# Patient Record
Sex: Female | Born: 1970
Health system: Southern US, Community
[De-identification: ages and names within clinical notes are randomized; demographics above are authoritative.]

## PROBLEM LIST (undated history)

## (undated) DIAGNOSIS — F419 Anxiety disorder, unspecified: Secondary | ICD-10-CM

## (undated) DIAGNOSIS — K2960 Other gastritis without bleeding: Secondary | ICD-10-CM

## (undated) DIAGNOSIS — I10 Essential (primary) hypertension: Secondary | ICD-10-CM

## (undated) DIAGNOSIS — E785 Hyperlipidemia, unspecified: Secondary | ICD-10-CM

## (undated) DIAGNOSIS — K449 Diaphragmatic hernia without obstruction or gangrene: Secondary | ICD-10-CM

## (undated) DIAGNOSIS — Z9889 Other specified postprocedural states: Secondary | ICD-10-CM

## (undated) DIAGNOSIS — J302 Other seasonal allergic rhinitis: Secondary | ICD-10-CM

## (undated) DIAGNOSIS — N809 Endometriosis, unspecified: Secondary | ICD-10-CM

## (undated) DIAGNOSIS — J45909 Unspecified asthma, uncomplicated: Secondary | ICD-10-CM

## (undated) DIAGNOSIS — T7840XA Allergy, unspecified, initial encounter: Secondary | ICD-10-CM

## (undated) DIAGNOSIS — R112 Nausea with vomiting, unspecified: Secondary | ICD-10-CM

## (undated) DIAGNOSIS — R519 Headache, unspecified: Secondary | ICD-10-CM

## (undated) DIAGNOSIS — L309 Dermatitis, unspecified: Secondary | ICD-10-CM

## (undated) DIAGNOSIS — R51 Headache: Secondary | ICD-10-CM

## (undated) DIAGNOSIS — K2210 Ulcer of esophagus without bleeding: Secondary | ICD-10-CM

## (undated) HISTORY — PX: ABDOMINAL HYSTERECTOMY: SHX81

## (undated) HISTORY — PX: BLADDER SURGERY: SHX569

## (undated) HISTORY — PX: CYST EXCISION: SHX5701

---

## 2003-10-10 ENCOUNTER — Other Ambulatory Visit: Admission: RE | Admit: 2003-10-10 | Discharge: 2003-10-10 | Payer: Self-pay | Admitting: Obstetrics and Gynecology

## 2004-02-06 ENCOUNTER — Ambulatory Visit (HOSPITAL_COMMUNITY): Admission: RE | Admit: 2004-02-06 | Discharge: 2004-02-06 | Payer: Self-pay | Admitting: Obstetrics and Gynecology

## 2004-07-09 ENCOUNTER — Inpatient Hospital Stay (HOSPITAL_COMMUNITY): Admission: RE | Admit: 2004-07-09 | Discharge: 2004-07-11 | Payer: Self-pay | Admitting: Obstetrics and Gynecology

## 2007-10-22 ENCOUNTER — Ambulatory Visit: Payer: Self-pay | Admitting: Orthopaedic Surgery

## 2007-10-22 ENCOUNTER — Other Ambulatory Visit: Payer: Self-pay

## 2007-10-29 ENCOUNTER — Ambulatory Visit: Payer: Self-pay | Admitting: Orthopaedic Surgery

## 2008-11-09 ENCOUNTER — Ambulatory Visit: Payer: Self-pay | Admitting: Family Medicine

## 2009-01-31 ENCOUNTER — Ambulatory Visit: Payer: Self-pay | Admitting: Family Medicine

## 2009-02-16 ENCOUNTER — Ambulatory Visit: Payer: Self-pay | Admitting: Internal Medicine

## 2009-12-26 ENCOUNTER — Encounter: Payer: Self-pay | Admitting: Orthopedic Surgery

## 2010-09-18 ENCOUNTER — Ambulatory Visit: Payer: Self-pay | Admitting: Family Medicine

## 2011-04-09 ENCOUNTER — Other Ambulatory Visit: Payer: Self-pay | Admitting: Neurology

## 2011-04-09 DIAGNOSIS — R0989 Other specified symptoms and signs involving the circulatory and respiratory systems: Secondary | ICD-10-CM

## 2011-04-15 ENCOUNTER — Ambulatory Visit
Admission: RE | Admit: 2011-04-15 | Discharge: 2011-04-15 | Disposition: A | Discharge status: Home / Self Care | Payer: 59 | Source: Ambulatory Visit | Attending: Neurology | Admitting: Neurology

## 2011-04-15 DIAGNOSIS — R0989 Other specified symptoms and signs involving the circulatory and respiratory systems: Secondary | ICD-10-CM

## 2011-09-19 ENCOUNTER — Ambulatory Visit: Payer: Self-pay | Admitting: Family Medicine

## 2012-06-12 ENCOUNTER — Emergency Department: Payer: Self-pay | Admitting: Emergency Medicine

## 2012-09-30 ENCOUNTER — Ambulatory Visit: Payer: Self-pay | Admitting: Family Medicine

## 2013-01-14 ENCOUNTER — Emergency Department: Payer: Self-pay | Admitting: Emergency Medicine

## 2013-11-04 ENCOUNTER — Ambulatory Visit: Payer: Self-pay | Admitting: Family Medicine

## 2014-11-08 ENCOUNTER — Other Ambulatory Visit: Payer: Self-pay | Admitting: Family Medicine

## 2014-11-08 ENCOUNTER — Ambulatory Visit
Admission: RE | Admit: 2014-11-08 | Discharge: 2014-11-08 | Disposition: A | Discharge status: Home / Self Care | Payer: 59 | Source: Ambulatory Visit | Attending: Family Medicine | Admitting: Family Medicine

## 2014-11-08 DIAGNOSIS — Z1239 Encounter for other screening for malignant neoplasm of breast: Secondary | ICD-10-CM

## 2014-11-08 DIAGNOSIS — Z1231 Encounter for screening mammogram for malignant neoplasm of breast: Secondary | ICD-10-CM | POA: Diagnosis not present

## 2015-02-10 ENCOUNTER — Encounter: Payer: Self-pay | Admitting: *Deleted

## 2015-02-13 ENCOUNTER — Encounter
Admission: RE | Disposition: A | Discharge status: Home Health | Payer: Self-pay | Source: Ambulatory Visit | Attending: Gastroenterology

## 2015-02-13 ENCOUNTER — Ambulatory Visit
Admission: RE | Admit: 2015-02-13 | Discharge: 2015-02-13 | Disposition: A | Discharge status: Home Health | Payer: 59 | Source: Ambulatory Visit | Attending: Gastroenterology | Admitting: Gastroenterology

## 2015-02-13 ENCOUNTER — Ambulatory Visit: Payer: 59

## 2015-02-13 ENCOUNTER — Encounter: Payer: Self-pay | Admitting: Anesthesiology

## 2015-02-13 ENCOUNTER — Ambulatory Visit: Payer: 59 | Admitting: Certified Registered Nurse Anesthetist

## 2015-02-13 DIAGNOSIS — K2960 Other gastritis without bleeding: Secondary | ICD-10-CM | POA: Diagnosis not present

## 2015-02-13 DIAGNOSIS — E785 Hyperlipidemia, unspecified: Secondary | ICD-10-CM | POA: Diagnosis not present

## 2015-02-13 DIAGNOSIS — Z79899 Other long term (current) drug therapy: Secondary | ICD-10-CM | POA: Insufficient documentation

## 2015-02-13 DIAGNOSIS — K2210 Ulcer of esophagus without bleeding: Secondary | ICD-10-CM | POA: Diagnosis not present

## 2015-02-13 DIAGNOSIS — I10 Essential (primary) hypertension: Secondary | ICD-10-CM | POA: Insufficient documentation

## 2015-02-13 DIAGNOSIS — R252 Cramp and spasm: Secondary | ICD-10-CM | POA: Insufficient documentation

## 2015-02-13 DIAGNOSIS — J45909 Unspecified asthma, uncomplicated: Secondary | ICD-10-CM | POA: Diagnosis not present

## 2015-02-13 DIAGNOSIS — K449 Diaphragmatic hernia without obstruction or gangrene: Secondary | ICD-10-CM | POA: Insufficient documentation

## 2015-02-13 DIAGNOSIS — R1013 Epigastric pain: Secondary | ICD-10-CM | POA: Diagnosis present

## 2015-02-13 DIAGNOSIS — K219 Gastro-esophageal reflux disease without esophagitis: Secondary | ICD-10-CM | POA: Insufficient documentation

## 2015-02-13 DIAGNOSIS — K319 Disease of stomach and duodenum, unspecified: Secondary | ICD-10-CM | POA: Insufficient documentation

## 2015-02-13 DIAGNOSIS — Z7951 Long term (current) use of inhaled steroids: Secondary | ICD-10-CM | POA: Insufficient documentation

## 2015-02-13 DIAGNOSIS — F419 Anxiety disorder, unspecified: Secondary | ICD-10-CM | POA: Insufficient documentation

## 2015-02-13 DIAGNOSIS — T819XXA Unspecified complication of procedure, initial encounter: Secondary | ICD-10-CM

## 2015-02-13 DIAGNOSIS — G43909 Migraine, unspecified, not intractable, without status migrainosus: Secondary | ICD-10-CM | POA: Insufficient documentation

## 2015-02-13 HISTORY — PX: ESOPHAGOGASTRODUODENOSCOPY (EGD) WITH PROPOFOL: SHX5813

## 2015-02-13 HISTORY — DX: Headache, unspecified: R51.9

## 2015-02-13 HISTORY — DX: Hyperlipidemia, unspecified: E78.5

## 2015-02-13 HISTORY — DX: Unspecified asthma, uncomplicated: J45.909

## 2015-02-13 HISTORY — DX: Essential (primary) hypertension: I10

## 2015-02-13 HISTORY — DX: Headache: R51

## 2015-02-13 HISTORY — DX: Anxiety disorder, unspecified: F41.9

## 2015-02-13 SURGERY — ESOPHAGOGASTRODUODENOSCOPY (EGD) WITH PROPOFOL
Anesthesia: General

## 2015-02-13 MED ORDER — ALBUTEROL SULFATE HFA 108 (90 BASE) MCG/ACT IN AERS
INHALATION_SPRAY | RESPIRATORY_TRACT | Status: DC | PRN
  Administered 2015-02-13: 4 via RESPIRATORY_TRACT

## 2015-02-13 MED ORDER — LIDOCAINE HCL (CARDIAC) 20 MG/ML IV SOLN
INTRAVENOUS | Status: DC | PRN
  Administered 2015-02-13: 100 mg via INTRAVENOUS

## 2015-02-13 MED ORDER — IPRATROPIUM-ALBUTEROL 0.5-2.5 (3) MG/3ML IN SOLN
RESPIRATORY_TRACT | Status: AC
  Administered 2015-02-13: 3 mL via RESPIRATORY_TRACT
  Filled 2015-02-13: qty 3

## 2015-02-13 MED ORDER — SODIUM CHLORIDE 0.9 % IV SOLN
INTRAVENOUS | Status: DC
  Administered 2015-02-13: 1000 mL via INTRAVENOUS

## 2015-02-13 MED ORDER — SODIUM CHLORIDE 0.9 % IV SOLN: INTRAVENOUS | Status: DC

## 2015-02-13 MED ORDER — IPRATROPIUM-ALBUTEROL 0.5-2.5 (3) MG/3ML IN SOLN
3.0000 mL | Freq: Once | RESPIRATORY_TRACT | Status: AC
  Administered 2015-02-13: 3 mL via RESPIRATORY_TRACT

## 2015-02-13 MED ORDER — PROPOFOL INFUSION 10 MG/ML OPTIME
INTRAVENOUS | Status: DC | PRN
  Administered 2015-02-13: 200 ug/kg/min via INTRAVENOUS

## 2015-02-13 MED ORDER — GLYCOPYRROLATE 0.2 MG/ML IJ SOLN
INTRAMUSCULAR | Status: DC | PRN
  Administered 2015-02-13: 0.2 mg via INTRAVENOUS

## 2015-02-13 MED ORDER — PROPOFOL 10 MG/ML IV BOLUS
INTRAVENOUS | Status: DC | PRN
  Administered 2015-02-13 (×2): 30 mg via INTRAVENOUS
  Administered 2015-02-13: 80 mg via INTRAVENOUS

## 2015-02-13 NOTE — Op Note (Signed)
[facility] Patient Name: Linda Lyons Procedure Date: 02/13/2015 3:43 PM MRN: [id number] Account #: [id number] Date of Birth: 11/14/1970 Admit Type: Outpatient Age: 44 Room: [facility] ENDO ROOM 4 Gender: Female Note Status: Finalized Procedure:         Upper GI endoscopy Indications:       Dyspepsia, Gastro-esophageal reflux disease Providers:         Christena Deem, MD Referring MD:      Marina Goodell (Referring MD) Medicines:         Monitored Anesthesia Care Complications:     No immediate complications. Procedure:         Pre-Anesthesia Assessment:                    - ASA Grade Assessment: III - A patient with severe                     systemic disease.                    After obtaining informed consent, the endoscope was passed                     under direct vision. Throughout the procedure, the                     patient's blood pressure, pulse, and oxygen saturations                     were monitored continuously. The Endoscope was introduced                     through the mouth, and advanced to the third part of                     duodenum. The upper GI endoscopy was accomplished without                     difficulty. The patient tolerated the procedure. Findings:      LA Grade D (one or more mucosal breaks involving at least 75% of       esophageal circumference) esophagitis with no bleeding was found.      A medium-sized hiatus hernia was found. The Z-line was a variable       distance from incisors; the hiatal hernia was sliding.      Patchy moderate inflammation characterized by congestion (edema),       erosions, erythema and linear erosions was found in the gastric antrum.       Biopsies were taken with a cold forceps for histology. Biopsies were       taken with a cold forceps for Helicobacter pylori testing.      The cardia and gastric fundus were normal on retroflexion otherwise.      Diffuse mucosal  flattening was found in the entire examined duodenum.       Biopsies were taken with a cold forceps for histology. Impression:        - LA Grade D erosive esophagitis.                    - Medium-sized hiatus hernia.                    - Erosive gastritis. Biopsied.                    -  Flattened mucosa was found in the duodenum, not                     consistent with celiac disease. Biopsied. Recommendation:    - Await pathology results.                    - Use Protonix (pantoprazole) 40 mg PO BID for 6 weeks.                    - Use Protonix (pantoprazole) 40 mg PO daily daily.                    - Repeat the upper endoscopy in 6 weeks to check healing. Procedure Code(s): --- Professional ---                    [phone], Esophagogastroduodenoscopy, flexible, transoral;                     with biopsy, single or multiple Diagnosis Code(s): --- Professional ---                    530.19, Other esophagitis                    535.40, Other specified gastritis, without mention of                     hemorrhage                    537.9, Unspecified disorder of stomach and duodenum                    536.8, Dyspepsia and other specified disorders of function                     of stomach                    530.81, Esophageal reflux                    553.3, Diaphragmatic hernia without mention of obstruction                     or gangrene CPT copyright 2014 [employer]. All rights reserved. The codes documented in this report are preliminary and upon coder review may  be revised to meet current compliance requirements. Christena Deem, MD 02/13/2015 4:01:42 PM This report has been signed electronically. Number of Addenda: 0 Note Initiated On: 02/13/2015 3:43 PM      [facility]

## 2015-02-13 NOTE — Anesthesia Preprocedure Evaluation (Signed)
Anesthesia Evaluation  Patient identified by MRN, date of birth, ID band Patient awake    Reviewed: Allergy & Precautions, H&P , NPO status , Patient's Chart, lab work & pertinent test results  History of Anesthesia Complications (+) PONV and history of anesthetic complications  Airway Mallampati: III  TM Distance: >3 FB Neck ROM: full    Dental no notable dental hx. (+) Teeth Intact   Pulmonary neg shortness of breath, asthma ,  breath sounds clear to auscultation  Pulmonary exam normal       Cardiovascular Exercise Tolerance: Good hypertension, - Past MI negative cardio ROS Normal cardiovascular examRhythm:regular Rate:Normal     Neuro/Psych  Headaches, negative neurological ROS  negative psych ROS   GI/Hepatic negative GI ROS, Neg liver ROS,   Endo/Other  negative endocrine ROS  Renal/GU negative Renal ROS  negative genitourinary   Musculoskeletal   Abdominal   Peds  Hematology negative hematology ROS (+)   Anesthesia Other Findings Past Medical History:   Hypertension                                                 Headache                                                     Anxiety                                                      Asthma                                                       Hyperlipidemia                                               Reproductive/Obstetrics negative OB ROS                             Anesthesia Physical Anesthesia Plan  ASA: III  Anesthesia Plan: General   Post-op Pain Management:    Induction:   Airway Management Planned:   Additional Equipment:   Intra-op Plan:   Post-operative Plan:   Informed Consent: I have reviewed the patients History and Physical, chart, labs and discussed the procedure including the risks, benefits and alternatives for the proposed anesthesia with the patient or authorized representative who has indicated  his/her understanding and acceptance.   Dental Advisory Given  Plan Discussed with: Anesthesiologist, CRNA and Surgeon  Anesthesia Plan Comments:         Anesthesia Quick Evaluation

## 2015-02-13 NOTE — H&P (Signed)
Outpatient short stay form Pre-procedure 02/13/2015 3:39 PM Christena Deem MD  Primary Physician: Dr. Maudie Flakes  Reason for visit:  Dyspepsia  History of present illness:  Patient is a 44 year old female presenting for evaluation regards to reflux issues/GERD. She was initially taking some Zantac however that was probably more so for allergies she has been on some omeprazole recently changed to Protonix 40 mg once a day. States that this may be a bit better but still is not getting rhythm with symptoms. Is no dysphagia.    Current facility-administered medications:  .  0.9 %  sodium chloride infusion, , Intravenous, Continuous, Christena Deem, MD, Last Rate: 20 mL/hr at 02/13/15 1404, 1,000 mL at 02/13/15 1404 .  0.9 %  sodium chloride infusion, , Intravenous, Continuous, Christena Deem, MD .  0.9 %  sodium chloride infusion, , Intravenous, Continuous, Christena Deem, MD  Facility-Administered Medications Ordered in Other Encounters:  .  albuterol (PROVENTIL HFA;VENTOLIN HFA) 108 (90 BASE) MCG/ACT inhaler, , , Anesthesia Intra-op, Rosaria Ferries, MD, 4 puff at 02/13/15 1532  Prescriptions prior to admission  Medication Sig Dispense Refill Last Dose  . amLODipine (NORVASC) 5 MG tablet Take 5 mg by mouth daily.   02/13/2015 at 0700  . calcium carbonate (OS-CAL) 600 MG TABS tablet Take 600 mg by mouth 2 (two) times daily with a meal.   02/12/2015 at Unknown time  . chlorzoxazone (PARAFON) 500 MG tablet Take 500 mg by mouth 4 (four) times daily as needed for muscle spasms.   02/12/2015 at Unknown time  . EPINEPHrine 0.3 mg/0.3 mL IJ SOAJ injection Inject into the muscle once.   02/12/2015 at Unknown time  . Fluticasone-Salmeterol (ADVAIR) 100-50 MCG/DOSE AEPB Inhale 1 puff into the lungs 2 (two) times daily.   02/12/2015 at Unknown time  . gabapentin (NEURONTIN) 300 MG capsule Take 300 mg by mouth 3 (three) times daily. Take . By mouth three times a day.  2 qam, 1 midday,  and 2 qpm   02/13/2015 at 0700  . loratadine (CLARITIN) 10 MG tablet Take 10 mg by mouth daily.   02/12/2015 at Unknown time  . metoprolol succinate (TOPROL-XL) 100 MG 24 hr tablet Take 100 mg by mouth daily. Take with or immediately following a meal.   02/13/2015 at 0700  . montelukast (SINGULAIR) 10 MG tablet Take 10 mg by mouth at bedtime.   02/13/2015 at 0700  . omeprazole (PRILOSEC) 20 MG capsule Take 20 mg by mouth daily.   02/12/2015 at Unknown time  . ranitidine (ZANTAC) 150 MG capsule Take 150 mg by mouth 2 (two) times daily.   02/12/2015 at Unknown time  . rizatriptan (MAXALT) 10 MG tablet Take 10 mg by mouth as needed for migraine. May repeat in 2 hours if needed   02/12/2015 at Unknown time  . venlafaxine (EFFEXOR) 75 MG tablet Take 75 mg by mouth 2 (two) times daily. Take 150 mg. By mouth 2 times a day   02/13/2015 at 0700     Allergies  Allergen Reactions  . Estrogens   . Lisinopril   . Sulfacetamide Sodium      Past Medical History  Diagnosis Date  . Hypertension   . Headache   . Anxiety   . Asthma   . Hyperlipidemia     Review of systems:      Physical Exam    Heart and lungs: Regular rate and rhythm without rub or gallop lungs are bilaterally clear.  HEENT: Normocephalic atraumatic eyes are anicteric    Other:     Pertinant exam for procedure: Soft nontender nondistended bowel sounds positive normoactive    Planned proceedures: EGD and indicated procedures I have discussed the risks benefits and complications of procedures to include not limited to bleeding, infection, perforation and the risk of sedation and the patient wishes to proceed.    Christena Deem, MD Gastroenterology 02/13/2015  3:39 PM

## 2015-02-13 NOTE — Transfer of Care (Signed)
Immediate Anesthesia Transfer of Care Note  Patient: Linda Lyons  Procedure(s) Performed: Procedure(s): ESOPHAGOGASTRODUODENOSCOPY (EGD) WITH PROPOFOL (N/A)  Patient Location: Endoscopy Unit  Anesthesia Type:General  Level of Consciousness: sedated  Airway & Oxygen Therapy: Patient Spontanous Breathing and Patient connected to nasal cannula oxygen  Post-op Assessment: Report given to RN and Post -op Vital signs reviewed and stable  Post vital signs: Reviewed and stable  Last Vitals:  Filed Vitals:   02/13/15 1351  BP: 131/86  Pulse: 76  Temp: 36.3 C  Resp: 16    Complications: No apparent anesthesia complications

## 2015-02-13 NOTE — Anesthesia Postprocedure Evaluation (Signed)
  Anesthesia Post-op Note  Patient: Linda Lyons  Procedure(s) Performed: Procedure(s): ESOPHAGOGASTRODUODENOSCOPY (EGD) WITH PROPOFOL (N/A)  Anesthesia type:General  Patient location: PACU  Post pain: Pain level controlled  Post assessment: Post-op Vital signs reviewed, Patient's Cardiovascular Status Stable, Respiratory Function Stable, Patent Airway and No signs of Nausea or vomiting  Post vital signs: Reviewed and stable  Last Vitals:  Filed Vitals:   02/13/15 1700  BP: 104/63  Pulse: 84  Temp:   Resp: 9    Level of consciousness: awake, alert  and patient cooperative  Complications:  Patient had severe coughing and obstructive episode thought to be laryngospasm and bronchospasm 2/2 reactive airway disease at the end of the case.  This episode responded to jaw thrust.  Symptoms resolved after patient received Duoneb in the post operative area.

## 2015-02-15 ENCOUNTER — Encounter: Payer: Self-pay | Admitting: Gastroenterology

## 2015-02-15 LAB — SURGICAL PATHOLOGY

## 2015-04-22 ENCOUNTER — Other Ambulatory Visit: Payer: Self-pay

## 2015-04-22 ENCOUNTER — Ambulatory Visit
Admission: EM | Admit: 2015-04-22 | Discharge: 2015-04-22 | Disposition: A | Discharge status: Home / Self Care | Payer: 59 | Attending: Family Medicine | Admitting: Family Medicine

## 2015-04-22 ENCOUNTER — Ambulatory Visit: Payer: 59

## 2015-04-22 DIAGNOSIS — E876 Hypokalemia: Secondary | ICD-10-CM | POA: Diagnosis not present

## 2015-04-22 DIAGNOSIS — J45909 Unspecified asthma, uncomplicated: Secondary | ICD-10-CM | POA: Diagnosis not present

## 2015-04-22 DIAGNOSIS — J40 Bronchitis, not specified as acute or chronic: Secondary | ICD-10-CM | POA: Diagnosis not present

## 2015-04-22 DIAGNOSIS — R079 Chest pain, unspecified: Secondary | ICD-10-CM | POA: Diagnosis present

## 2015-04-22 DIAGNOSIS — J9811 Atelectasis: Secondary | ICD-10-CM | POA: Diagnosis not present

## 2015-04-22 DIAGNOSIS — M940 Chondrocostal junction syndrome [Tietze]: Secondary | ICD-10-CM | POA: Diagnosis not present

## 2015-04-22 DIAGNOSIS — Z79899 Other long term (current) drug therapy: Secondary | ICD-10-CM | POA: Insufficient documentation

## 2015-04-22 DIAGNOSIS — E785 Hyperlipidemia, unspecified: Secondary | ICD-10-CM | POA: Diagnosis not present

## 2015-04-22 DIAGNOSIS — I10 Essential (primary) hypertension: Secondary | ICD-10-CM | POA: Insufficient documentation

## 2015-04-22 DIAGNOSIS — F419 Anxiety disorder, unspecified: Secondary | ICD-10-CM | POA: Diagnosis not present

## 2015-04-22 DIAGNOSIS — R0789 Other chest pain: Secondary | ICD-10-CM

## 2015-04-22 LAB — CBC WITH DIFFERENTIAL/PLATELET
BASOS ABS: 0.1 10*3/uL (ref 0–0.1)
BASOS PCT: 1 %
Eosinophils Absolute: 0.3 10*3/uL (ref 0–0.7)
Eosinophils Relative: 3 %
HEMATOCRIT: 39.6 % (ref 35.0–47.0)
HEMOGLOBIN: 13 g/dL (ref 12.0–16.0)
LYMPHS PCT: 16 %
Lymphs Abs: 1.8 10*3/uL (ref 1.0–3.6)
MCH: 28.9 pg (ref 26.0–34.0)
MCHC: 32.9 g/dL (ref 32.0–36.0)
MCV: 87.9 fL (ref 80.0–100.0)
MONO ABS: 0.7 10*3/uL (ref 0.2–0.9)
Monocytes Relative: 6 %
NEUTROS ABS: 8.3 10*3/uL — AB (ref 1.4–6.5)
NEUTROS PCT: 74 %
Platelets: 279 10*3/uL (ref 150–440)
RBC: 4.51 MIL/uL (ref 3.80–5.20)
RDW: 12.8 % (ref 11.5–14.5)
WBC: 11.2 10*3/uL — ABNORMAL HIGH (ref 3.6–11.0)

## 2015-04-22 LAB — COMPREHENSIVE METABOLIC PANEL
ALK PHOS: 92 U/L (ref 38–126)
ALT: 31 U/L (ref 14–54)
ANION GAP: 12 (ref 5–15)
AST: 28 U/L (ref 15–41)
Albumin: 4 g/dL (ref 3.5–5.0)
BILIRUBIN TOTAL: 0.3 mg/dL (ref 0.3–1.2)
BUN: 24 mg/dL — ABNORMAL HIGH (ref 6–20)
CALCIUM: 8.8 mg/dL — AB (ref 8.9–10.3)
CO2: 27 mmol/L (ref 22–32)
Chloride: 95 mmol/L — ABNORMAL LOW (ref 101–111)
Creatinine, Ser: 0.8 mg/dL (ref 0.44–1.00)
GLUCOSE: 108 mg/dL — AB (ref 65–99)
POTASSIUM: 3.2 mmol/L — AB (ref 3.5–5.1)
Sodium: 134 mmol/L — ABNORMAL LOW (ref 135–145)
TOTAL PROTEIN: 7.1 g/dL (ref 6.5–8.1)

## 2015-04-22 LAB — CK: CK TOTAL: 76 U/L (ref 38–234)

## 2015-04-22 LAB — CKMB (ARMC ONLY): CK, MB: 1.5 ng/mL (ref 0.5–5.0)

## 2015-04-22 LAB — FIBRIN DERIVATIVES D-DIMER (ARMC ONLY): Fibrin derivatives D-dimer (ARMC): 388.46 (ref 0–499)

## 2015-04-22 LAB — TROPONIN I

## 2015-04-22 MED ORDER — AZITHROMYCIN 250 MG PO TABS: ORAL_TABLET | ORAL | Status: DC

## 2015-04-22 MED ORDER — KETOROLAC TROMETHAMINE 60 MG/2ML IM SOLN
60.0000 mg | Freq: Once | INTRAMUSCULAR | Status: AC
  Administered 2015-04-22: 60 mg via INTRAMUSCULAR

## 2015-04-22 MED ORDER — PREDNISONE 10 MG (21) PO TBPK: ORAL_TABLET | ORAL | Status: DC

## 2015-04-22 MED ORDER — HYDROCOD POLST-CPM POLST ER 10-8 MG/5ML PO SUER: 5.0000 mL | Freq: Two times a day (BID) | ORAL | Status: DC | PRN

## 2015-04-22 MED ORDER — POTASSIUM CHLORIDE CRYS ER 20 MEQ PO TBCR
40.0000 meq | EXTENDED_RELEASE_TABLET | Freq: Once | ORAL | Status: AC
  Administered 2015-04-22: 40 meq via ORAL

## 2015-04-22 MED ORDER — MELOXICAM 15 MG PO TABS: 15.0000 mg | ORAL_TABLET | Freq: Every day | ORAL | Status: DC

## 2015-04-22 NOTE — Discharge Instructions (Signed)
Chest Wall Pain Chest wall pain is pain in or around the bones and muscles of your chest. Sometimes, an injury causes this pain. Sometimes, the cause may not be known. This pain may take several weeks or longer to get better. HOME CARE Pay attention to any changes in your symptoms. Take these actions to help with your pain:  Rest as told by your doctor.  Avoid activities that cause pain. Try not to use your chest, belly (abdominal), or side muscles to lift heavy things.  If directed, apply ice to the painful area:  Put ice in a plastic bag.  Place a towel between your skin and the bag.  Leave the ice on for 20 minutes, 2-3 times per day.  Take over-the-counter and prescription medicines only as told by your doctor.  Do not use tobacco products, including cigarettes, chewing tobacco, and e-cigarettes. If you need help quitting, ask your doctor.  Keep all follow-up visits as told by your doctor. This is important. GET HELP IF:  You have a fever.  Your chest pain gets worse.  You have new symptoms. GET HELP RIGHT AWAY IF:  You feel sick to your stomach (nauseous) or you throw up (vomit).  You feel sweaty or light-headed.  You have a cough with phlegm (sputum) or you cough up blood.  You are short of breath.   This information is not intended to replace advice given to you by your health care provider. Make sure you discuss any questions you have with your health care provider.   Document Released: 11/27/2007 Document Revised: 03/01/2015 Document Reviewed: 09/05/2014 Elsevier Interactive Patient Education 2016 [employer]  Costochondritis Costochondritis is a condition in which the tissue (cartilage) that connects your ribs with your breastbone (sternum) becomes irritated. It causes pain in the chest and rib area. It usually goes away on its own over time. HOME CARE  Avoid activities that wear you out.  Do not strain your ribs. Avoid activities that use  your:  Chest.  Belly.  Side muscles.  Put ice on the area for the first 2 days after the pain starts.  Put ice in a plastic bag.  Place a towel between your skin and the bag.  Leave the ice on for 20 minutes, 2-3 times a day.  Only take medicine as told by your doctor. GET HELP IF:  You have redness or puffiness (swelling) in the rib area.  Your pain does not go away with rest or medicine. GET HELP RIGHT AWAY IF:   Your pain gets worse.  You are very uncomfortable.  You have trouble breathing.  You cough up blood.  You start sweating or throwing up (vomiting).  You have a fever or lasting symptoms for more than 2-3 days.  You have a fever and your symptoms suddenly get worse. MAKE SURE YOU:   Understand these instructions.  Will watch your condition.  Will get help right away if you are not doing well or get worse.   This information is not intended to replace advice given to you by your health care provider. Make sure you discuss any questions you have with your health care provider.   Document Released: 11/27/2007 Document Revised: 02/10/2013 Document Reviewed: 01/12/2013 Elsevier Interactive Patient Education 2016 [employer]  Hypokalemia Hypokalemia means that the amount of potassium in the blood is lower than normal.Potassium is a chemical, called an electrolyte, that helps regulate the amount of fluid in the body. It also stimulates muscle contraction and  helps nerves function properly.Most of the body's potassium is inside of cells, and only a very small amount is in the blood. Because the amount in the blood is so small, minor changes can be life-threatening. CAUSES  Antibiotics.  Diarrhea or vomiting.  Using laxatives too much, which can cause diarrhea.  Chronic kidney disease.  Water pills (diuretics).  Eating disorders (bulimia).  Low magnesium level.  Sweating a lot. SIGNS AND  SYMPTOMS  Weakness.  Constipation.  Fatigue.  Muscle cramps.  Mental confusion.  Skipped heartbeats or irregular heartbeat (palpitations).  Tingling or numbness. DIAGNOSIS  Your health care provider can diagnose hypokalemia with blood tests. In addition to checking your potassium level, your health care provider may also check other lab tests. TREATMENT Hypokalemia can be treated with potassium supplements taken by mouth or adjustments in your current medicines. If your potassium level is very low, you may need to get potassium through a vein (IV) and be monitored in the hospital. A diet high in potassium is also helpful. Foods high in potassium are:  Nuts, such as peanuts and pistachios.  Seeds, such as sunflower seeds and pumpkin seeds.  Peas, lentils, and lima beans.  Whole grain and bran cereals and breads.  Fresh fruit and vegetables, such as apricots, avocado, bananas, cantaloupe, kiwi, oranges, tomatoes, asparagus, and potatoes.  Orange and tomato juices.  Red meats.  Fruit yogurt. HOME CARE INSTRUCTIONS  Take all medicines as prescribed by your health care provider.  Maintain a healthy diet by including nutritious food, such as fruits, vegetables, nuts, whole grains, and lean meats.  If you are taking a laxative, be sure to follow the directions on the label. SEEK MEDICAL CARE IF:  Your weakness gets worse.  You feel your heart pounding or racing.  You are vomiting or having diarrhea.  You are diabetic and having trouble keeping your blood glucose in the normal range. SEEK IMMEDIATE MEDICAL CARE IF:  You have chest pain, shortness of breath, or dizziness.  You are vomiting or having diarrhea for more than 2 days.  You faint. MAKE SURE YOU:   Understand these instructions.  Will watch your condition.  Will get help right away if you are not doing well or get worse.   This information is not intended to replace advice given to you by your health  care provider. Make sure you discuss any questions you have with your health care provider.   Document Released: 06/10/2005 Document Revised: 07/01/2014 Document Reviewed: 12/11/2012 Elsevier Interactive Patient Education 2016 [employer] Inc.  Upper Respiratory Infection, Adult Most upper respiratory infections (URIs) are caused by a virus. A URI affects the nose, throat, and upper air passages. The most common type of URI is often called "the common cold." HOME CARE   Take medicines only as told by your doctor.  Gargle warm saltwater or take cough drops to comfort your throat as told by your doctor.  Use a warm mist humidifier or inhale steam from a shower to increase air moisture. This may make it easier to breathe.  Drink enough fluid to keep your pee (urine) clear or pale yellow.  Eat soups and other clear broths.  Have a healthy diet.  Rest as needed.  Go back to work when your fever is gone or your doctor says it is okay.  You may need to stay home longer to avoid giving your URI to others.  You can also wear a face mask and wash your hands often to prevent  spread of the virus.  Use your inhaler more if you have asthma.  Do not use any tobacco products, including cigarettes, chewing tobacco, or electronic cigarettes. If you need help quitting, ask your doctor. GET HELP IF:  You are getting worse, not better.  Your symptoms are not helped by medicine.  You have chills.  You are getting more short of breath.  You have brown or red mucus.  You have yellow or brown discharge from your nose.  You have pain in your face, especially when you bend forward.  You have a fever.  You have puffy (swollen) neck glands.  You have pain while swallowing.  You have white areas in the back of your throat. GET HELP RIGHT AWAY IF:   You have very bad or constant:  Headache.  Ear pain.  Pain in your forehead, behind your eyes, and over your cheekbones (sinus  pain).  Chest pain.  You have long-lasting (chronic) lung disease and any of the following:  Wheezing.  Long-lasting cough.  Coughing up blood.  A change in your usual mucus.  You have a stiff neck.  You have changes in your:  Vision.  Hearing.  Thinking.  Mood. MAKE SURE YOU:   Understand these instructions.  Will watch your condition.  Will get help right away if you are not doing well or get worse.   This information is not intended to replace advice given to you by your health care provider. Make sure you discuss any questions you have with your health care provider.   Document Released: 11/27/2007 Document Revised: 10/25/2014 Document Reviewed: 09/15/2013 Elsevier Interactive Patient Education 2016 [employer] Inc.  Potassium Content of Foods Potassium is a mineral found in many foods and drinks. It helps keep fluids and minerals balanced in your body and affects how steadily your heart beats. Potassium also helps control your blood pressure and keep your muscles and nervous system healthy. Certain health conditions and medicines may change the balance of potassium in your body. When this happens, you can help balance your level of potassium through the foods that you do or do not eat. Your health care provider or dietitian may recommend an amount of potassium that you should have each day. The following lists of foods provide the amount of potassium (in parentheses) per serving in each item. HIGH IN POTASSIUM  The following foods and beverages have 200 mg or more of potassium per serving:  Apricots, 2 raw or 5 dry (200 mg).  Artichoke, 1 medium (345 mg).  Avocado, raw,  each (245 mg).  Banana, 1 medium (425 mg).  Beans, lima, or baked beans, canned,  cup (280 mg).  Beans, white, canned,  cup (595 mg).  Beef roast, 3 oz (320 mg).  Beef, ground, 3 oz (270 mg).  Beets, raw or cooked,  cup (260 mg).  Bran muffin, 2 oz (300 mg).  Broccoli,  cup  (230 mg).  Brussels sprouts,  cup (250 mg).  Cantaloupe,  cup (215 mg).  Cereal, 100% bran,  cup (200-400 mg).  Cheeseburger, single, fast food, 1 each (225-400 mg).  Chicken, 3 oz (220 mg).  Clams, canned, 3 oz (535 mg).  Crab, 3 oz (225 mg).  Dates, 5 each (270 mg).  Dried beans and peas,  cup (300-475 mg).  Figs, dried, 2 each (260 mg).  Fish: halibut, tuna, cod, snapper, 3 oz (480 mg).  Fish: salmon, haddock, swordfish, perch, 3 oz (300 mg).  Fish, tuna, canned 3 oz (  200 mg).  [place] fries, fast food, 3 oz (470 mg).  Granola with fruit and nuts,  cup (200 mg).  Grapefruit juice,  cup (200 mg).  Greens, beet,  cup (655 mg).  Honeydew melon,  cup (200 mg).  Kale, raw, 1 cup (300 mg).  Kiwi, 1 medium (240 mg).  Kohlrabi, rutabaga, parsnips,  cup (280 mg).  Lentils,  cup (365 mg).  Mango, 1 each (325 mg).  Milk, chocolate, 1 cup (420 mg).  Milk: nonfat, low-fat, whole, buttermilk, 1 cup (350-380 mg).  Molasses, 1 Tbsp (295 mg).  Mushrooms,  cup (280) mg.  Nectarine, 1 each (275 mg).  Nuts: almonds, peanuts, hazelnuts, [place], cashew, mixed, 1 oz (200 mg).  Nuts, pistachios, 1 oz (295 mg).  Orange, 1 each (240 mg).  Orange juice,  cup (235 mg).  Papaya, medium,  fruit (390 mg).  Peanut butter, chunky, 2 Tbsp (240 mg).  Peanut butter, smooth, 2 Tbsp (210 mg).  Pear, 1 medium (200 mg).  Pomegranate, 1 whole (400 mg).  Pomegranate juice,  cup (215 mg).  Pork, 3 oz (350 mg).  Potato chips, salted, 1 oz (465 mg).  Potato, baked with skin, 1 medium (925 mg).  Potatoes, boiled,  cup (255 mg).  Potatoes, mashed,  cup (330 mg).  Prune juice,  cup (370 mg).  Prunes, 5 each (305 mg).  Pudding, chocolate,  cup (230 mg).  Pumpkin, canned,  cup (250 mg).  Raisins, seedless,  cup (270 mg).  Seeds, sunflower or pumpkin, 1 oz (240 mg).  Soy milk, 1 cup (300 mg).  Spinach,  cup (420 mg).  Spinach, canned,  cup  (370 mg).  Sweet potato, baked with skin, 1 medium (450 mg).  Swiss chard,  cup (480 mg).  Tomato or vegetable juice,  cup (275 mg).  Tomato sauce or puree,  cup (400-550 mg).  Tomato, raw, 1 medium (290 mg).  Tomatoes, canned,  cup (200-300 mg).  [place], 3 oz (250 mg).  Wheat germ, 1 oz (250 mg).  Winter squash,  cup (250 mg).  Yogurt, plain or fruited, 6 oz (260-435 mg).  Zucchini,  cup (220 mg). MODERATE IN POTASSIUM The following foods and beverages have 50-200 mg of potassium per serving:  Apple, 1 each (150 mg).  Apple juice,  cup (150 mg).  Applesauce,  cup (90 mg).  Apricot nectar,  cup (140 mg).  Asparagus, small spears,  cup or 6 spears (155 mg).  Bagel, cinnamon raisin, 1 each (130 mg).  Bagel, egg or plain, 4 in., 1 each (70 mg).  Beans, green,  cup (90 mg).  Beans, yellow,  cup (190 mg).  Beer, regular, 12 oz (100 mg).  Beets, canned,  cup (125 mg).  Blackberries,  cup (115 mg).  Blueberries,  cup (60 mg).  Bread, whole wheat, 1 slice (70 mg).  Broccoli, raw,  cup (145 mg).  Cabbage,  cup (150 mg).  Carrots, cooked or raw,  cup (180 mg).  Cauliflower, raw,  cup (150 mg).  Celery, raw,  cup (155 mg).  Cereal, bran flakes, cup (120-150 mg).  Cheese, cottage,  cup (110 mg).  Cherries, 10 each (150 mg).  Chocolate, 1 oz bar (165 mg).  Coffee, brewed 6 oz (90 mg).  Corn,  cup or 1 ear (195 mg).  Cucumbers,  cup (80 mg).  Egg, large, 1 each (60 mg).  Eggplant,  cup (60 mg).  Endive, raw, cup (80 mg).  English muffin, 1 each (65 mg).  Fish, orange roughy, 3 oz (150 mg).  Frankfurter, beef or pork, 1 each (75 mg).  Fruit cocktail,  cup (115 mg).  Grape juice,  cup (170 mg).  Grapefruit,  fruit (175 mg).  Grapes,  cup (155 mg).  Greens: kale, turnip, collard,  cup (110-150 mg).  Ice cream or frozen yogurt, chocolate,  cup (175 mg).  Ice cream or frozen yogurt, vanilla,  cup  (120-150 mg).  Lemons, limes, 1 each (80 mg).  Lettuce, all types, 1 cup (100 mg).  Mixed vegetables,  cup (150 mg).  Mushrooms, raw,  cup (110 mg).  Nuts: walnuts, pecans, or macadamia, 1 oz (125 mg).  Oatmeal,  cup (80 mg).  Okra,  cup (110 mg).  Onions, raw,  cup (120 mg).  Peach, 1 each (185 mg).  Peaches, canned,  cup (120 mg).  Pears, canned,  cup (120 mg).  Peas, green, frozen,  cup (90 mg).  Peppers, green,  cup (130 mg).  Peppers, red,  cup (160 mg).  Pineapple juice,  cup (165 mg).  Pineapple, fresh or canned,  cup (100 mg).  Plums, 1 each (105 mg).  Pudding, vanilla,  cup (150 mg).  Raspberries,  cup (90 mg).  Rhubarb,  cup (115 mg).  Rice, wild,  cup (80 mg).  Shrimp, 3 oz (155 mg).  Spinach, raw, 1 cup (170 mg).  Strawberries,  cup (125 mg).  Summer squash  cup (175-200 mg).  Swiss chard, raw, 1 cup (135 mg).  Tangerines, 1 each (140 mg).  Tea, brewed, 6 oz (65 mg).  Turnips,  cup (140 mg).  Watermelon,  cup (85 mg).  Wine, red, table, 5 oz (180 mg).  Wine, white, table, 5 oz (100 mg). LOW IN POTASSIUM The following foods and beverages have less than 50 mg of potassium per serving.  Bread, white, 1 slice (30 mg).  Carbonated beverages, 12 oz (less than 5 mg).  Cheese, 1 oz (20-30 mg).  Cranberries,  cup (45 mg).  Cranberry juice cocktail,  cup (20 mg).  Fats and oils, 1 Tbsp (less than 5 mg).  Hummus, 1 Tbsp (32 mg).  Nectar: papaya, mango, or pear,  cup (35 mg).  Rice, white or brown,  cup (50 mg).  Spaghetti or macaroni,  cup cooked (30 mg).  Tortilla, flour or corn, 1 each (50 mg).  Waffle, 4 in., 1 each (50 mg).  Water chestnuts,  cup (40 mg).   This information is not intended to replace advice given to you by your health care provider. Make sure you discuss any questions you have with your health care provider.   Document Released: 01/22/2005 Document Revised: 06/15/2013  Document Reviewed: 05/07/2013 Elsevier Interactive Patient Education [employer]

## 2015-04-22 NOTE — ED Notes (Signed)
Pt c/o left sided chest pain that started yesterday, worse with movement and deep breathing..states she has asthma and has been using her inhaler without relief and states "it different, Im not having any wheezing and the pain is like sharp stabbing"..none radiating pain..Marland Kitchen

## 2015-04-22 NOTE — ED Provider Notes (Signed)
CSN: [id number]     Arrival date & time 04/22/15  [id number] History   First MD Initiated Contact with Patient 04/22/15 [phone]     Nurses notes were reviewed Chief Complaint  Patient presents with  . Chest Pain   individual here because of chest pain. She states chest pain started about 2:00 just afternoon before she went to work the evening. She states when she takes a deep breath or move she has sharp pain in the left side of her chest lasted all during the night. She is unable to work and finally came in this morning to be seen and evaluated. She states that about 1-2 weeks ago she had increased shortness of breath and cough and had to use her Advair inhaler on a regular basis. She states cough seemed to be finally getting better when the chest pain started yesterday. The pain is worse when she coughs. She denies any family history sudden death. Father had sounds like a viral myocarditis which subsequently led to myocardial infarction and he died in his late 6960s. Her cholesterol has been elevated and she does not know how high and she is on Crestor medication. She does have history of hypertension but denies any history of smoking and exposure to smoke. (Consider location/radiation/quality/duration/timing/severity/associated sxs/prior Treatment) Patient is a 44 y.o. female presenting with chest pain. The history is provided by the patient. No language interpreter was used.  Chest Pain Pain location:  Substernal area and L chest Pain quality: dull and sharp   Pain radiates to:  Does not radiate Pain radiates to the back: no   Pain severity:  Moderate Onset quality: about 24 hrs ago. Duration:  1 day Timing:  Intermittent Progression:  Waxing and waning Chronicity:  New Context: breathing and movement   Context: not eating, not raising an arm and not at rest   Relieved by:  Certain positions Worsened by:  Coughing, deep breathing and movement Ineffective treatments:  None tried Associated  symptoms: cough and shortness of breath   Associated symptoms: no dizziness, no dysphagia, no fatigue, no fever, no nausea, no palpitations and not vomiting     Past Medical History  Diagnosis Date  . Hypertension   . Headache   . Anxiety   . Asthma   . Hyperlipidemia    Past Surgical History  Procedure Laterality Date  . Abdominal hysterectomy    . Bladder surgery    . Cyst excision Right     rt. wrist  . Esophagogastroduodenoscopy (egd) with propofol N/A 02/13/2015    Procedure: ESOPHAGOGASTRODUODENOSCOPY (EGD) WITH PROPOFOL;  Surgeon: Christena DeemMartin U Skulskie, MD;  [facility] END[facility];  Service: Endoscopy;  Laterality: N/A;   Family History  Problem Relation Age of Onset  . Breast cancer Mother 162  . Breast cancer Maternal Aunt 6968   Social History  Substance Use Topics  . Smoking status: Never Smoker   . Smokeless tobacco: None  . Alcohol Use: No   OB History    No data available     Review of Systems  Constitutional: Negative for fever and fatigue.  HENT: Negative for trouble swallowing.   Respiratory: Positive for cough and shortness of breath.   Cardiovascular: Positive for chest pain. Negative for palpitations.  Gastrointestinal: Negative for nausea and vomiting.  Neurological: Negative for dizziness.    Allergies  Estrogens; Lisinopril; and Sulfacetamide sodium  Home Medications   Prior to Admission medications   Medication Sig Start Date End Date Taking? Authorizing Provider  hydrochlorothiazide (HYDRODIURIL) 12.5 MG tablet Take 12.5 mg by mouth daily.   Yes Historical Provider, MD  pantoprazole (PROTONIX) 40 MG tablet Take 40 mg by mouth daily.   Yes Historical Provider, MD  amLODipine (NORVASC) 5 MG tablet Take 5 mg by mouth daily.    Historical Provider, MD  azithromycin (ZITHROMAX Z-PAK) 250 MG tablet Take 2 tablets first day and then 1 po a day for 4 days 04/22/15   Hassan Rowan, MD  calcium carbonate (OS-CAL) 600 MG TABS tablet Take 600 mg by mouth 2  (two) times daily with a meal.    Historical Provider, MD  chlorpheniramine-HYDROcodone (TUSSIONEX PENNKINETIC ER) 10-8 MG/5ML SUER Take 5 mLs by mouth every 12 (twelve) hours as needed for cough. 04/22/15   Hassan Rowan, MD  chlorzoxazone (PARAFON) 500 MG tablet Take 500 mg by mouth 4 (four) times daily as needed for muscle spasms.    Historical Provider, MD  EPINEPHrine 0.3 mg/0.3 mL IJ SOAJ injection Inject into the muscle once.    Historical Provider, MD  Fluticasone-Salmeterol (ADVAIR) 100-50 MCG/DOSE AEPB Inhale 1 puff into the lungs 2 (two) times daily.    Historical Provider, MD  gabapentin (NEURONTIN) 300 MG capsule Take 300 mg by mouth 3 (three) times daily. Take . By mouth three times a day.  2 qam, 1 midday, and 2 qpm    Historical Provider, MD  loratadine (CLARITIN) 10 MG tablet Take 10 mg by mouth daily.    Historical Provider, MD  meloxicam (MOBIC) 15 MG tablet Take 1 tablet (15 mg total) by mouth daily. Do not take w/motrin or alleve 04/22/15   Hassan Rowan, MD  metoprolol succinate (TOPROL-XL) 100 MG 24 hr tablet Take 100 mg by mouth daily. Take with or immediately following a meal.    Historical Provider, MD  montelukast (SINGULAIR) 10 MG tablet Take 10 mg by mouth at bedtime.    Historical Provider, MD  omeprazole (PRILOSEC) 20 MG capsule Take 20 mg by mouth daily.    Historical Provider, MD  predniSONE (STERAPRED UNI-PAK 21 TAB) 10 MG (21) TBPK tablet Sig 6 tablet day 1, 5 tablets day 2, 4 tablets day 3,,3tablets day 4, 2 tablets day 5, 1 tablet day 6 take all tablets orally 04/22/15   Hassan Rowan, MD  ranitidine (ZANTAC) 150 MG capsule Take 150 mg by mouth 2 (two) times daily.    Historical Provider, MD  rizatriptan (MAXALT) 10 MG tablet Take 10 mg by mouth as needed for migraine. May repeat in 2 hours if needed    Historical Provider, MD  venlafaxine (EFFEXOR) 75 MG tablet Take 75 mg by mouth 2 (two) times daily. Take 150 mg. By mouth 2 times a day    Historical Provider, MD    Meds Ordered and Administered this Visit   Medications  ketorolac (TORADOL) injection 60 mg (60 mg Intramuscular Given 04/22/15 0935)  potassium chloride SA (K-DUR,KLOR-CON) CR tablet 40 mEq (40 mEq Oral Given 04/22/15 1116)    BP 142/95 mmHg  Pulse 93  Temp(Src) 98 F (36.7 C) (Oral)  Resp 18  Ht  (1.626 m)  Wt 180 lb (81.647 kg)  BMI 30.88 kg/m2  SpO2 100% No data found.   Physical Exam  Constitutional: She is oriented to person, place, and time. She appears well-developed and well-nourished.  HENT:  Head: Normocephalic and atraumatic.  Eyes: Pupils are equal, round, and reactive to light.  Neck: Normal range of motion. Neck supple. No tracheal deviation present.  Cardiovascular: Normal rate, regular rhythm and normal heart sounds.   Pulmonary/Chest: Effort normal.  Abdominal: Soft. Bowel sounds are normal. She exhibits no distension.  Musculoskeletal: Normal range of motion. She exhibits no edema or tenderness.  Neurological: She is alert and oriented to person, place, and time.  Skin: Skin is warm and dry.  Psychiatric: She has a normal mood and affect. Her behavior is normal.  Vitals reviewed.   ED Course  Procedures (including critical care time)  Labs Review Labs Reviewed  TROPONIN I - Abnormal; Notable for the following:    Troponin I <0.30 (*)    All other components within normal limits  CBC WITH DIFFERENTIAL/PLATELET - Abnormal; Notable for the following:    WBC 11.2 (*)    Neutro Abs 8.3 (*)    All other components within normal limits  COMPREHENSIVE METABOLIC PANEL - Abnormal; Notable for the following:    Sodium 134 (*)    Potassium 3.2 (*)    Chloride 95 (*)    Glucose, Bld 108 (*)    BUN 24 (*)    Calcium 8.8 (*)    All other components within normal limits  FIBRIN DERIVATIVES D-DIMER ([facility] ONLY)  CK  CKMB([facility] ONLY)    Imaging Review Dg Chest 2 View  04/22/2015  CLINICAL DATA:  Sharp upper left chest pain.  Productive cough.  EXAM: CHEST  2 VIEW COMPARISON:  02/13/2015 FINDINGS: Heart size and pulmonary vascularity are normal. Moderate hiatal hernia, unchanged. Slight atelectasis at the left lung base. Lungs are otherwise clear. No effusions. No osseous abnormality. No pneumothorax. IMPRESSION: Slight atelectasis at the left lung base.  Moderate hiatal hernia. Electronically Signed   By: Francene Boyers M.D.   On: 04/22/2015 11:48     Visual Acuity Review  Right Eye Distance:   Left Eye Distance:   Bilateral Distance:    Right Eye Near:   Left Eye Near:    Bilateral Near:      ED ECG REPORT   Date: 04/22/2015  EKG Time: 12:14 PM  Rate: 82  Rhythm: normal sinus rhythm,  there are no previous tracings available for comparison, left axis deviation  Axis: 45  Intervals:none  ST&T Change: none  Narrative Interpretation: Minimal LVH voltage criteria    Results for orders placed or performed during the hospital encounter of 04/22/15  Fibrin derivatives D-Dimer ([facility] only)  Result Value Ref Range   Fibrin derivatives D-dimer ([facility]) 388.46 0 - 499  Troponin I  Result Value Ref Range   Troponin I <0.30 (H) <0.031 ng/mL  CBC with Differential  Result Value Ref Range   WBC 11.2 (H) 3.6 - 11.0 K/uL   RBC 4.51 3.80 - 5.20 MIL/uL   Hemoglobin 13.0 12.0 - 16.0 g/dL   HCT 16.1 09.6 - 04.5 %   MCV 87.9 80.0 - 100.0 fL   MCH 28.9 26.0 - 34.0 pg   MCHC 32.9 32.0 - 36.0 g/dL   RDW 40.9 81.1 - 91.4 %   Platelets 279 150 - 440 K/uL   Neutrophils Relative % 74 %   Neutro Abs 8.3 (H) 1.4 - 6.5 K/uL   Lymphocytes Relative 16 %   Lymphs Abs 1.8 1.0 - 3.6 K/uL   Monocytes Relative 6 %   Monocytes Absolute 0.7 0.2 - 0.9 K/uL   Eosinophils Relative 3 %   Eosinophils Absolute 0.3 0 - 0.7 K/uL   Basophils Relative 1 %   Basophils Absolute 0.1 0 - 0.1 K/uL  Comprehensive metabolic panel  Result Value Ref Range   Sodium 134 (L) 135 - 145 mmol/L   Potassium 3.2 (L) 3.5 - 5.1 mmol/L   Chloride 95 (L) 101 - 111  mmol/L   CO2 27 22 - 32 mmol/L   Glucose, Bld 108 (H) 65 - 99 mg/dL   BUN 24 (H) 6 - 20 mg/dL   Creatinine, Ser 1.61 0.44 - 1.00 mg/dL   Calcium 8.8 (L) 8.9 - 10.3 mg/dL   Total Protein 7.1 6.5 - 8.1 g/dL   Albumin 4.0 3.5 - 5.0 g/dL   AST 28 15 - 41 U/L   ALT 31 14 - 54 U/L   Alkaline Phosphatase 92 38 - 126 U/L   Total Bilirubin PENDING 0.3 - 1.2 mg/dL   GFR calc non Af Amer >60 >60 mL/min   GFR calc Af Amer >60 >60 mL/min   Anion gap 12 5 - 15  CK  Result Value Ref Range   Total CK 76 38 - 234 U/L  CKMB([facility] only)  Result Value Ref Range   CK, MB 1.5 0.5 - 5.0 ng/mL          MDM   1. Atelectasis of left lung   2. Chest wall pain   3. Costochondritis, acute   4. Hypokalemia   5. Bronchitis    Patient after getting the 60 Toradol IM did not have much improvement. Chest x-rays did show some atelectasis in the left side will treat with antibiotic Z-Pak, Mobic and a six-day course of prednisone as well.Maryclare Labrador also have her continue taking or using her albuterol and Advair Diskus. We'll add Tussionex 1 teaspoon twice a day for the cough and give her work note for today and tomorrow is strongly suggest follow-up with PCP in 1-2 weeks for follow-up. If she scheduled to have chest pain or discomfort coated local ED follow-up.    Hassan Rowan, MD 04/22/15 [phone]

## 2015-07-03 ENCOUNTER — Ambulatory Visit: Admission: RE | Admit: 2015-07-03 | Payer: 59 | Source: Ambulatory Visit | Admitting: Gastroenterology

## 2015-07-03 ENCOUNTER — Encounter: Admission: RE | Payer: Self-pay | Source: Ambulatory Visit

## 2015-07-03 HISTORY — DX: Endometriosis, unspecified: N80.9

## 2015-07-03 HISTORY — DX: Dermatitis, unspecified: L30.9

## 2015-07-03 HISTORY — DX: Ulcer of esophagus without bleeding: K22.10

## 2015-07-03 HISTORY — DX: Hyperlipidemia, unspecified: E78.5

## 2015-07-03 HISTORY — DX: Diaphragmatic hernia without obstruction or gangrene: K44.9

## 2015-07-03 HISTORY — DX: Other gastritis without bleeding: K29.60

## 2015-07-03 HISTORY — DX: Allergy, unspecified, initial encounter: T78.40XA

## 2015-07-03 SURGERY — ESOPHAGOGASTRODUODENOSCOPY (EGD) WITH PROPOFOL
Anesthesia: General

## 2015-08-01 ENCOUNTER — Ambulatory Visit: Payer: 59 | Admitting: Anesthesiology

## 2015-08-01 ENCOUNTER — Ambulatory Visit
Admission: RE | Admit: 2015-08-01 | Discharge: 2015-08-01 | Disposition: A | Discharge status: Home / Self Care | Payer: 59 | Source: Ambulatory Visit | Attending: Gastroenterology | Admitting: Gastroenterology

## 2015-08-01 ENCOUNTER — Encounter: Payer: Self-pay | Admitting: *Deleted

## 2015-08-01 ENCOUNTER — Encounter
Admission: RE | Disposition: A | Discharge status: Home / Self Care | Payer: Self-pay | Source: Ambulatory Visit | Attending: Gastroenterology

## 2015-08-01 DIAGNOSIS — K319 Disease of stomach and duodenum, unspecified: Secondary | ICD-10-CM | POA: Insufficient documentation

## 2015-08-01 DIAGNOSIS — K2960 Other gastritis without bleeding: Secondary | ICD-10-CM | POA: Diagnosis not present

## 2015-08-01 DIAGNOSIS — E785 Hyperlipidemia, unspecified: Secondary | ICD-10-CM | POA: Diagnosis not present

## 2015-08-01 DIAGNOSIS — J45909 Unspecified asthma, uncomplicated: Secondary | ICD-10-CM | POA: Insufficient documentation

## 2015-08-01 DIAGNOSIS — I10 Essential (primary) hypertension: Secondary | ICD-10-CM | POA: Insufficient documentation

## 2015-08-01 DIAGNOSIS — K449 Diaphragmatic hernia without obstruction or gangrene: Secondary | ICD-10-CM | POA: Insufficient documentation

## 2015-08-01 DIAGNOSIS — K219 Gastro-esophageal reflux disease without esophagitis: Secondary | ICD-10-CM | POA: Diagnosis present

## 2015-08-01 DIAGNOSIS — F419 Anxiety disorder, unspecified: Secondary | ICD-10-CM | POA: Diagnosis not present

## 2015-08-01 DIAGNOSIS — Z79899 Other long term (current) drug therapy: Secondary | ICD-10-CM | POA: Insufficient documentation

## 2015-08-01 DIAGNOSIS — K210 Gastro-esophageal reflux disease with esophagitis: Secondary | ICD-10-CM | POA: Diagnosis not present

## 2015-08-01 DIAGNOSIS — G43909 Migraine, unspecified, not intractable, without status migrainosus: Secondary | ICD-10-CM | POA: Insufficient documentation

## 2015-08-01 DIAGNOSIS — K2210 Ulcer of esophagus without bleeding: Secondary | ICD-10-CM | POA: Diagnosis not present

## 2015-08-01 HISTORY — PX: ESOPHAGOGASTRODUODENOSCOPY (EGD) WITH PROPOFOL: SHX5813

## 2015-08-01 SURGERY — ESOPHAGOGASTRODUODENOSCOPY (EGD) WITH PROPOFOL
Anesthesia: General

## 2015-08-01 MED ORDER — SODIUM CHLORIDE 0.9 % IV SOLN
INTRAVENOUS | Status: DC
  Administered 2015-08-01: 08:00:00 via INTRAVENOUS

## 2015-08-01 MED ORDER — PROPOFOL 500 MG/50ML IV EMUL
INTRAVENOUS | Status: DC | PRN
  Administered 2015-08-01: 140 ug/kg/min via INTRAVENOUS

## 2015-08-01 MED ORDER — MIDAZOLAM HCL 5 MG/5ML IJ SOLN
INTRAMUSCULAR | Status: DC | PRN
  Administered 2015-08-01: 1 mg via INTRAVENOUS

## 2015-08-01 MED ORDER — FENTANYL CITRATE (PF) 100 MCG/2ML IJ SOLN
INTRAMUSCULAR | Status: DC | PRN
  Administered 2015-08-01: 50 ug via INTRAVENOUS

## 2015-08-01 MED ORDER — GLYCOPYRROLATE 0.2 MG/ML IJ SOLN
INTRAMUSCULAR | Status: DC | PRN
  Administered 2015-08-01: 0.2 mg via INTRAVENOUS

## 2015-08-01 MED ORDER — SODIUM CHLORIDE 0.9 % IV SOLN: INTRAVENOUS | Status: DC

## 2015-08-01 NOTE — Anesthesia Preprocedure Evaluation (Signed)
Anesthesia Evaluation  Patient identified by MRN, date of birth, ID band Patient awake    Reviewed: Allergy & Precautions, NPO status , Patient's Chart, lab work & pertinent test results  Airway Mallampati: II       Dental  (+) Teeth Intact   Pulmonary asthma ,    breath sounds clear to auscultation       Cardiovascular Exercise Tolerance: Good hypertension, Pt. on home beta blockers  Rhythm:Regular     Neuro/Psych  Headaches, Anxiety    GI/Hepatic negative GI ROS, Neg liver ROS, hiatal hernia, PUD,   Endo/Other  negative endocrine ROS  Renal/GU negative Renal ROS     Musculoskeletal negative musculoskeletal ROS (+)   Abdominal Normal abdominal exam  (+)   Peds  Hematology negative hematology ROS (+)   Anesthesia Other Findings   Reproductive/Obstetrics                             Anesthesia Physical Anesthesia Plan  ASA: II  Anesthesia Plan: General   Post-op Pain Management:    Induction: Intravenous  Airway Management Planned: Natural Airway and Nasal Cannula  Additional Equipment:   Intra-op Plan:   Post-operative Plan:   Informed Consent: I have reviewed the patients History and Physical, chart, labs and discussed the procedure including the risks, benefits and alternatives for the proposed anesthesia with the patient or authorized representative who has indicated his/her understanding and acceptance.     Plan Discussed with:   Anesthesia Plan Comments:         Anesthesia Quick Evaluation

## 2015-08-01 NOTE — H&P (Signed)
Outpatient short stay form Pre-procedure 08/01/2015 8:45 AM Linda Deem MD  Primary Physician: Dr. Maudie Flakes  Reason for visit:  Patient is a 45 year old female is presenting today for EGD.  History of present illness:  Patient has a history of severe erosive esophagitis. She continues to have some mild symptoms. She is on a twice a day PPI. She presenting for EGD for recheck biopsy.    Current facility-administered medications:  .  0.9 %  sodium chloride infusion, , Intravenous, Continuous, Linda Deem, MD, Last Rate: 20 mL/hr at 08/01/15 0814 .  0.9 %  sodium chloride infusion, , Intravenous, Continuous, Linda Deem, MD  Prescriptions prior to admission  Medication Sig Dispense Refill Last Dose  . amLODipine (NORVASC) 5 MG tablet Take 5 mg by mouth daily.   08/01/2015 at 0635  . Fluticasone-Salmeterol (ADVAIR) 100-50 MCG/DOSE AEPB Inhale 1 puff into the lungs 2 (two) times daily.   08/01/2015 at 0635  . gabapentin (NEURONTIN) 300 MG capsule Take 300 mg by mouth 3 (three) times daily. Take . By mouth three times a day.  2 qam, 1 midday, and 2 qpm   08/01/2015 at 0635  . metoprolol succinate (TOPROL-XL) 100 MG 24 hr tablet Take 100 mg by mouth daily. Take with or immediately following a meal.   08/01/2015 at 0635  . montelukast (SINGULAIR) 10 MG tablet Take 10 mg by mouth at bedtime.   08/01/2015 at 0635  . pantoprazole (PROTONIX) 40 MG tablet Take 40 mg by mouth daily.   08/01/2015 at 0635  . ranitidine (ZANTAC) 150 MG capsule Take 150 mg by mouth 2 (two) times daily.   08/01/2015 at 0635  . venlafaxine (EFFEXOR) 75 MG tablet Take 75 mg by mouth 2 (two) times daily. Take 150 mg. By mouth 2 times a day   08/01/2015 at 3635  . azithromycin (ZITHROMAX Z-PAK) 250 MG tablet Take 2 tablets first day and then 1 po a day for 4 days (Patient not taking: Reported on 08/01/2015) 6 tablet 0 Completed Course at Unknown time  . calcium carbonate (OS-CAL) 600 MG TABS tablet Take 600 mg by mouth  2 (two) times daily with a meal.   02/12/2015 at Unknown time  . chlorpheniramine-HYDROcodone (TUSSIONEX PENNKINETIC ER) 10-8 MG/5ML SUER Take 5 mLs by mouth every 12 (twelve) hours as needed for cough. 115 mL 0   . chlorzoxazone (PARAFON) 500 MG tablet Take 500 mg by mouth 4 (four) times daily as needed for muscle spasms.   02/12/2015 at Unknown time  . EPINEPHrine 0.3 mg/0.3 mL IJ SOAJ injection Inject into the muscle once.   02/12/2015 at Unknown time  . hydrochlorothiazide (HYDRODIURIL) 12.5 MG tablet Take 12.5 mg by mouth daily. Reported on 08/01/2015   Not Taking at Unknown time  . loratadine (CLARITIN) 10 MG tablet Take 10 mg by mouth daily.   02/12/2015 at Unknown time  . meloxicam (MOBIC) 15 MG tablet Take 1 tablet (15 mg total) by mouth daily. Do not take w/motrin or alleve (Patient not taking: Reported on 08/01/2015) 30 tablet 1 Not Taking at Unknown time  . omeprazole (PRILOSEC) 20 MG capsule Take 20 mg by mouth daily.   02/12/2015 at Unknown time  . predniSONE (STERAPRED UNI-PAK 21 TAB) 10 MG (21) TBPK tablet Sig 6 tablet day 1, 5 tablets day 2, 4 tablets day 3,,3tablets day 4, 2 tablets day 5, 1 tablet day 6 take all tablets orally (Patient not taking: Reported on 08/01/2015) 21 tablet 0  Completed Course at Unknown time  . rizatriptan (MAXALT) 10 MG tablet Take 10 mg by mouth as needed for migraine. May repeat in 2 hours if needed   02/12/2015 at Unknown time     Allergies  Allergen Reactions  . Estrogens   . Lisinopril   . Sulfacetamide Sodium      Past Medical History  Diagnosis Date  . Hypertension   . Headache   . Anxiety   . Asthma   . Hyperlipidemia   . Allergic state   . Eczema   . Endometriosis   . Erosive gastritis   . Erosive esophagitis   . HH (hiatus hernia)   . Hyperlipemia     Review of systems:      Physical Exam    Heart and lungs: Regular rate and rhythm without rub or gallop, lungs are bilaterally clear.    HEENT: Normocephalic atraumatic eyes are  anicteric    Other:     Pertinant exam for procedure: Soft nontender nondistended bowel sounds positive normoactive.    Planned proceedures: EGD and indicated procedures. I have discussed the risks benefits and complications of procedures to include not limited to bleeding, infection, perforation and the risk of sedation and the patient wishes to proceed.    Linda Deem, MD Gastroenterology 08/01/2015  8:45 AM

## 2015-08-01 NOTE — Anesthesia Postprocedure Evaluation (Signed)
Anesthesia Post Note  Patient: Linda Lyons  Procedure(s) Performed: Procedure(s) (LRB): ESOPHAGOGASTRODUODENOSCOPY (EGD) WITH PROPOFOL (N/A)  Patient location during evaluation: PACU Anesthesia Type: General Level of consciousness: awake Pain management: satisfactory to patient Vital Signs Assessment: post-procedure vital signs reviewed and stable Respiratory status: nonlabored ventilation Cardiovascular status: stable Anesthetic complications: no    Last Vitals:  Filed Vitals:   08/01/15 0922 08/01/15 0930  BP: 121/64 106/74  Pulse: 89 79  Temp: 36.5 C   Resp: 14 17    Last Pain: There were no vitals filed for this visit.               VAN STAVEREN,Mickal Meno

## 2015-08-01 NOTE — Op Note (Signed)
[facility] Gastroenterology Patient Name: Linda Lyons Procedure Date: 08/01/2015 8:49 AM MRN: [id number] Account #: [id number] Date of Birth: 11-23-1970 Admit Type: Outpatient Age: 45 Room: [facility] ENDO ROOM 3 Gender: Female Note Status: Finalized Procedure:         Upper GI endoscopy Indications:       Gastro-esophageal reflux disease, Follow-up of                     gastro-esophageal reflux disease Providers:         Christena Deem, MD Referring MD:      Marina Goodell (Referring MD) Medicines:         Monitored Anesthesia Care Complications:     No immediate complications. Procedure:         Pre-Anesthesia Assessment:                    - ASA Grade Assessment: II - A patient with mild systemic                     disease.                    After obtaining informed consent, the endoscope was passed                     under direct vision. Throughout the procedure, the                     patient's blood pressure, pulse, and oxygen saturations                     were monitored continuously. The Endoscope was introduced                     through the mouth, and advanced to the pylorus. The upper                     GI endoscopy was accomplished without difficulty. The                     patient tolerated the procedure well. Findings:      LA Grade C (one or more mucosal breaks continuous between tops of 2 or       more mucosal folds, less than 75% circumference) esophagitis with no       bleeding was found. Biopsies were taken with a cold forceps for       histology.      A medium-sized hiatus hernia was found. The Z-line was a variable       distance from incisors; the hiatal hernia was sliding.      Patchy moderate inflammation characterized by congestion (edema),       erosions, erythema and granularity was found in the gastric antrum.       Biopsies were taken with a cold forceps for histology.      retroflex evaluation of the stomach showed  the moderate sliding hiatal       hernia and gastritis Impression:        - LA Grade C erosive esophagitis. Biopsied.                    - Medium-sized hiatus hernia.                    - Erosive gastritis.  Biopsied. Recommendation:    - Use sucralfate suspension 1 gram PO QID for 1 month.                    - Return to GI clinic in 1 month. Procedure Code(s): --- Professional ---                    [phone], 52, Esophagogastroduodenoscopy, flexible,                     transoral; with biopsy, single or multiple Diagnosis Code(s): --- Professional ---                    K20.8, Other esophagitis                    K44.9, Diaphragmatic hernia without obstruction or gangrene                    K29.60, Other gastritis without bleeding                    K21.9, Gastro-esophageal reflux disease without esophagitis CPT copyright 2014 American Medical Association. All rights reserved. The codes documented in this report are preliminary and upon coder review may  be revised to meet current compliance requirements. Christena Deem, MD 08/01/2015 9:23:10 AM This report has been signed electronically. Number of Addenda: 0 Note Initiated On: 08/01/2015 8:49 AM      [facility]

## 2015-08-01 NOTE — Transfer of Care (Signed)
Immediate Anesthesia Transfer of Care Note  Patient: Linda Lyons  Procedure(s) Performed: Procedure(s): ESOPHAGOGASTRODUODENOSCOPY (EGD) WITH PROPOFOL (N/A)  Patient Location: PACU  Anesthesia Type:General  Level of Consciousness: sedated  Airway & Oxygen Therapy: Patient Spontanous Breathing and Patient connected to nasal cannula oxygen  Post-op Assessment: Report given to RN and Post -op Vital signs reviewed and stable  Post vital signs: Reviewed and stable  Last Vitals:  Filed Vitals:   08/01/15 0920 08/01/15 0922  BP: 121/64 121/64  Pulse: 84 89  Temp: 36.5 C 36.5 C  Resp: 18 14    Complications: No apparent anesthesia complications

## 2015-08-02 LAB — SURGICAL PATHOLOGY

## 2015-08-03 ENCOUNTER — Other Ambulatory Visit: Payer: Self-pay | Admitting: Gastroenterology

## 2015-08-03 DIAGNOSIS — R1310 Dysphagia, unspecified: Secondary | ICD-10-CM

## 2015-08-14 ENCOUNTER — Ambulatory Visit: Admission: RE | Admit: 2015-08-14 | Payer: 59 | Source: Ambulatory Visit

## 2015-08-14 ENCOUNTER — Ambulatory Visit
Admission: RE | Admit: 2015-08-14 | Discharge: 2015-08-14 | Disposition: A | Discharge status: Home / Self Care | Payer: 59 | Source: Ambulatory Visit | Attending: Gastroenterology | Admitting: Gastroenterology

## 2015-08-14 DIAGNOSIS — R1310 Dysphagia, unspecified: Secondary | ICD-10-CM | POA: Insufficient documentation

## 2015-08-14 DIAGNOSIS — K449 Diaphragmatic hernia without obstruction or gangrene: Secondary | ICD-10-CM | POA: Diagnosis not present

## 2015-08-24 ENCOUNTER — Encounter: Payer: Self-pay | Admitting: *Deleted

## 2015-09-05 ENCOUNTER — Encounter: Payer: Self-pay | Admitting: General Surgery

## 2015-09-05 ENCOUNTER — Ambulatory Visit (INDEPENDENT_AMBULATORY_CARE_PROVIDER_SITE_OTHER): Payer: 59 | Admitting: General Surgery

## 2015-09-05 VITALS — BP 130/74 | HR 76 | Resp 12 | Ht 64.0 in | Wt 183.0 lb

## 2015-09-05 DIAGNOSIS — K449 Diaphragmatic hernia without obstruction or gangrene: Secondary | ICD-10-CM

## 2015-09-05 NOTE — Patient Instructions (Signed)
Patient to return as needed. 

## 2015-09-05 NOTE — Progress Notes (Signed)
Patient ID: Linda Lyons, female   DOB: 1971-02-05, 45 y.o.   MRN: [id number]  Chief Complaint  Patient presents with  . Other    hernia    HPI Linda Lyons is a 45 y.o. female here today for a evaluation of hiatal hernia. She states she had a upper endoscopy 08/01/15.Patient states she has a lot of acid reflex and sometimes has trouble swallowing pills.   I had attempted to cancel appointment and have patient seen directly by Effie Shy, M.D.    HPI  Past Medical History  Diagnosis Date  . Hypertension   . Headache   . Anxiety   . Asthma   . Hyperlipidemia   . Allergic state   . Eczema   . Endometriosis   . Erosive gastritis   . Erosive esophagitis   . HH (hiatus hernia)   . Hyperlipemia     Past Surgical History  Procedure Laterality Date  . Abdominal hysterectomy    . Bladder surgery    . Cyst excision Right     rt. wrist  . Esophagogastroduodenoscopy (egd) with propofol N/A 02/13/2015    Procedure: ESOPHAGOGASTRODUODENOSCOPY (EGD) WITH PROPOFOL;  Surgeon: Christena Deem, MD;  Location: [facility] ENDOSCOPY;  Service: Endoscopy;  Laterality: N/A;  . Esophagogastroduodenoscopy (egd) with propofol N/A 08/01/2015    Procedure: ESOPHAGOGASTRODUODENOSCOPY (EGD) WITH PROPOFOL;  Surgeon: Christena Deem, MD;  Location: [facility] ENDOSCOPY;  Service: Endoscopy;  Laterality: N/A;    Family History  Problem Relation Age of Onset  . Breast cancer Mother 70  . Breast cancer Maternal Aunt 64    Social History Social History  Substance Use Topics  . Smoking status: Never Smoker   . Smokeless tobacco: None  . Alcohol Use: No    Allergies  Allergen Reactions  . Estrogens   . Lisinopril   . Sulfacetamide Sodium     Current Outpatient Prescriptions  Medication Sig Dispense Refill  . amLODipine (NORVASC) 5 MG tablet Take 5 mg by mouth daily.    . calcium carbonate (OS-CAL) 600 MG TABS tablet Take 600 mg by mouth 2 (two) times daily with a meal.    .  chlorpheniramine-HYDROcodone (TUSSIONEX PENNKINETIC ER) 10-8 MG/5ML SUER Take 5 mLs by mouth every 12 (twelve) hours as needed for cough. 115 mL 0  . chlorzoxazone (PARAFON) 500 MG tablet Take 500 mg by mouth 4 (four) times daily as needed for muscle spasms.    Marland Kitchen EPINEPHrine 0.3 mg/0.3 mL IJ SOAJ injection Inject into the muscle once.    . Fluticasone-Salmeterol (ADVAIR) 100-50 MCG/DOSE AEPB Inhale 1 puff into the lungs 2 (two) times daily.    Marland Kitchen gabapentin (NEURONTIN) 300 MG capsule Take 300 mg by mouth 3 (three) times daily. Take . By mouth three times a day.  2 qam, 1 midday, and 2 qpm    . loratadine (CLARITIN) 10 MG tablet Take 10 mg by mouth daily.    . meloxicam (MOBIC) 15 MG tablet Take 1 tablet (15 mg total) by mouth daily. Do not take w/motrin or alleve 30 tablet 1  . metoprolol succinate (TOPROL-XL) 100 MG 24 hr tablet Take 100 mg by mouth daily. Take with or immediately following a meal.    . montelukast (SINGULAIR) 10 MG tablet Take 10 mg by mouth at bedtime.    Marland Kitchen omeprazole (PRILOSEC) 20 MG capsule Take 20 mg by mouth daily.    . pantoprazole (PROTONIX) 40 MG tablet Take 40 mg by mouth daily.    Marland Kitchen  predniSONE (STERAPRED UNI-PAK 21 TAB) 10 MG (21) TBPK tablet Sig 6 tablet day 1, 5 tablets day 2, 4 tablets day 3,,3tablets day 4, 2 tablets day 5, 1 tablet day 6 take all tablets orally 21 tablet 0  . ranitidine (ZANTAC) 150 MG capsule Take 150 mg by mouth 2 (two) times daily.    . rizatriptan (MAXALT) 10 MG tablet Take 10 mg by mouth as needed for migraine. May repeat in 2 hours if needed    . venlafaxine (EFFEXOR) 75 MG tablet Take 75 mg by mouth 2 (two) times daily. Take 150 mg. By mouth 2 times a day     No current facility-administered medications for this visit.    Review of Systems Review of Systems  Constitutional: Negative.   Respiratory: Negative.   Cardiovascular: Negative.     Blood pressure 130/74, pulse 76, resp. rate 12, height 5\' 4"  (1.626 m), weight 183 lb  (83.008 kg).  Physical Exam Physical Exam  Constitutional: She is oriented to person, place, and time. She appears well-developed and well-nourished.  Neurological: She is alert and oriented to person, place, and time.  Skin: Skin is warm and dry.    Data Reviewed GI notes  Assessment    Paraesophageal hernia     Plan    Will arrange assessment w/ Effie ShyMichael Tyner, MD     Patient to return as needed.  PCP:  Linda Lyons This information has been scribed by Ples SpecterJessica Qualls CMA.    Earline MayotteByrnett, Tyland Klemens W 09/05/2015, 9:26 PM

## 2015-09-06 ENCOUNTER — Telehealth: Payer: Self-pay | Admitting: *Deleted

## 2015-09-06 NOTE — Telephone Encounter (Signed)
-----   Message from Earline MayotteJeffrey W Byrnett, MD sent at 09/05/2015  9:28 PM EDT ----- Arrange appointment w/ Effie ShyMichael Tyner, MD re: paraesophageal hernia.

## 2015-09-06 NOTE — Telephone Encounter (Signed)
Patient was contacted this morning and notified that her records have been forwarded to Dr. Allayne Butcheryner's office and that they will be in contact with her to arrange an appointment.   She verbalizes understanding.

## 2015-09-18 ENCOUNTER — Encounter: Payer: Self-pay | Admitting: *Deleted

## 2015-09-18 NOTE — Progress Notes (Signed)
Patient ID: Linda Lyons, female   DOB: 07/06/1970, 45 y.o.   MRN: [id number]  Patient has been scheduled for an appointment with Dr. Alva Garnetyner for 09-27-15 at 10:40 am (at [facility] per [place]).

## 2015-10-19 ENCOUNTER — Other Ambulatory Visit: Payer: Self-pay | Admitting: Family Medicine

## 2015-10-19 DIAGNOSIS — Z1231 Encounter for screening mammogram for malignant neoplasm of breast: Secondary | ICD-10-CM

## 2015-11-13 ENCOUNTER — Ambulatory Visit
Admission: RE | Admit: 2015-11-13 | Discharge: 2015-11-13 | Disposition: A | Discharge status: Home / Self Care | Payer: 59 | Source: Ambulatory Visit | Attending: Family Medicine | Admitting: Family Medicine

## 2015-11-13 DIAGNOSIS — Z1231 Encounter for screening mammogram for malignant neoplasm of breast: Secondary | ICD-10-CM | POA: Insufficient documentation

## 2016-01-18 ENCOUNTER — Other Ambulatory Visit: Payer: Self-pay | Admitting: Bariatrics

## 2016-01-18 DIAGNOSIS — R05 Cough: Secondary | ICD-10-CM

## 2016-01-18 DIAGNOSIS — R059 Cough, unspecified: Secondary | ICD-10-CM

## 2016-01-24 ENCOUNTER — Ambulatory Visit: Admission: RE | Admit: 2016-01-24 | Payer: 59 | Source: Ambulatory Visit

## 2016-02-05 ENCOUNTER — Ambulatory Visit
Admission: RE | Admit: 2016-02-05 | Discharge: 2016-02-05 | Disposition: A | Discharge status: Home / Self Care | Payer: 59 | Source: Ambulatory Visit | Attending: Bariatrics | Admitting: Bariatrics

## 2016-02-05 DIAGNOSIS — R059 Cough, unspecified: Secondary | ICD-10-CM

## 2016-02-05 DIAGNOSIS — R05 Cough: Secondary | ICD-10-CM | POA: Insufficient documentation

## 2016-04-04 ENCOUNTER — Other Ambulatory Visit: Payer: Self-pay | Admitting: Family Medicine

## 2016-04-10 ENCOUNTER — Other Ambulatory Visit: Payer: Self-pay | Admitting: Family Medicine

## 2016-04-10 DIAGNOSIS — J90 Pleural effusion, not elsewhere classified: Secondary | ICD-10-CM

## 2016-04-16 ENCOUNTER — Ambulatory Visit
Admission: RE | Admit: 2016-04-16 | Discharge: 2016-04-16 | Disposition: A | Discharge status: Home / Self Care | Payer: 59 | Source: Ambulatory Visit | Attending: Family Medicine | Admitting: Family Medicine

## 2016-04-16 ENCOUNTER — Ambulatory Visit: Payer: 59

## 2016-04-16 DIAGNOSIS — J9811 Atelectasis: Secondary | ICD-10-CM | POA: Diagnosis not present

## 2016-04-16 DIAGNOSIS — J90 Pleural effusion, not elsewhere classified: Secondary | ICD-10-CM | POA: Insufficient documentation

## 2016-04-16 MED ORDER — IOPAMIDOL (ISOVUE-300) INJECTION 61%
75.0000 mL | Freq: Once | INTRAVENOUS | Status: AC | PRN
  Administered 2016-04-16: 75 mL via INTRAVENOUS

## 2016-07-26 DIAGNOSIS — J301 Allergic rhinitis due to pollen: Secondary | ICD-10-CM | POA: Diagnosis not present

## 2016-07-26 DIAGNOSIS — J3081 Allergic rhinitis due to animal (cat) (dog) hair and dander: Secondary | ICD-10-CM | POA: Diagnosis not present

## 2016-07-26 DIAGNOSIS — J3089 Other allergic rhinitis: Secondary | ICD-10-CM | POA: Diagnosis not present

## 2016-07-30 DIAGNOSIS — J3081 Allergic rhinitis due to animal (cat) (dog) hair and dander: Secondary | ICD-10-CM | POA: Diagnosis not present

## 2016-07-30 DIAGNOSIS — J3089 Other allergic rhinitis: Secondary | ICD-10-CM | POA: Diagnosis not present

## 2016-07-30 DIAGNOSIS — J301 Allergic rhinitis due to pollen: Secondary | ICD-10-CM | POA: Diagnosis not present

## 2016-08-08 DIAGNOSIS — J3089 Other allergic rhinitis: Secondary | ICD-10-CM | POA: Diagnosis not present

## 2016-08-08 DIAGNOSIS — J301 Allergic rhinitis due to pollen: Secondary | ICD-10-CM | POA: Diagnosis not present

## 2016-08-08 DIAGNOSIS — J3081 Allergic rhinitis due to animal (cat) (dog) hair and dander: Secondary | ICD-10-CM | POA: Diagnosis not present

## 2016-08-13 DIAGNOSIS — J301 Allergic rhinitis due to pollen: Secondary | ICD-10-CM | POA: Diagnosis not present

## 2016-08-13 DIAGNOSIS — J3081 Allergic rhinitis due to animal (cat) (dog) hair and dander: Secondary | ICD-10-CM | POA: Diagnosis not present

## 2016-08-13 DIAGNOSIS — J3089 Other allergic rhinitis: Secondary | ICD-10-CM | POA: Diagnosis not present

## 2016-08-21 ENCOUNTER — Encounter: Payer: Self-pay | Admitting: Emergency Medicine

## 2016-08-21 ENCOUNTER — Emergency Department: Payer: 59

## 2016-08-21 ENCOUNTER — Emergency Department
Admission: EM | Admit: 2016-08-21 | Discharge: 2016-08-21 | Disposition: A | Discharge status: Home / Self Care | Payer: 59 | Attending: Emergency Medicine | Admitting: Emergency Medicine

## 2016-08-21 DIAGNOSIS — I10 Essential (primary) hypertension: Secondary | ICD-10-CM | POA: Diagnosis not present

## 2016-08-21 DIAGNOSIS — R079 Chest pain, unspecified: Secondary | ICD-10-CM | POA: Diagnosis not present

## 2016-08-21 DIAGNOSIS — J45909 Unspecified asthma, uncomplicated: Secondary | ICD-10-CM | POA: Insufficient documentation

## 2016-08-21 DIAGNOSIS — J189 Pneumonia, unspecified organism: Secondary | ICD-10-CM | POA: Diagnosis not present

## 2016-08-21 DIAGNOSIS — R0602 Shortness of breath: Secondary | ICD-10-CM | POA: Diagnosis not present

## 2016-08-21 DIAGNOSIS — Z79899 Other long term (current) drug therapy: Secondary | ICD-10-CM | POA: Insufficient documentation

## 2016-08-21 DIAGNOSIS — R0789 Other chest pain: Secondary | ICD-10-CM | POA: Diagnosis not present

## 2016-08-21 LAB — TROPONIN I: Troponin I: <0.03 ng/mL (ref <0.03)

## 2016-08-21 LAB — BASIC METABOLIC PANEL
Anion gap: 8 (ref 5–15)
BUN: 21 mg/dL — ABNORMAL HIGH (ref 6–20)
CO2: 29 mmol/L (ref 22–32)
CREATININE: 0.82 mg/dL (ref 0.44–1.00)
Calcium: 9.3 mg/dL (ref 8.9–10.3)
Chloride: 104 mmol/L (ref 101–111)
GLUCOSE: 100 mg/dL — AB (ref 65–99)
Potassium: 4 mmol/L (ref 3.5–5.1)
Sodium: 141 mmol/L (ref 135–145)

## 2016-08-21 LAB — CBC
HEMATOCRIT: 42.1 % (ref 35.0–47.0)
Hemoglobin: 13.8 g/dL (ref 12.0–16.0)
MCH: 29 pg (ref 26.0–34.0)
MCHC: 32.7 g/dL (ref 32.0–36.0)
MCV: 88.7 fL (ref 80.0–100.0)
PLATELETS: 275 10*3/uL (ref 150–440)
RBC: 4.75 MIL/uL (ref 3.80–5.20)
RDW: 12.7 % (ref 11.5–14.5)
WBC: 12.6 10*3/uL — AB (ref 3.6–11.0)

## 2016-08-21 MED ORDER — LEVOFLOXACIN 750 MG PO TABS: 750.0000 mg | ORAL_TABLET | Freq: Every day | ORAL | 0 refills | Status: AC

## 2016-08-21 MED ORDER — ONDANSETRON HCL 4 MG/2ML IJ SOLN
4.0000 mg | Freq: Once | INTRAMUSCULAR | Status: AC
  Administered 2016-08-21: 4 mg via INTRAVENOUS
  Filled 2016-08-21: qty 2

## 2016-08-21 MED ORDER — LEVOFLOXACIN 750 MG PO TABS
750.0000 mg | ORAL_TABLET | Freq: Once | ORAL | Status: AC
  Administered 2016-08-21: 750 mg via ORAL
  Filled 2016-08-21: qty 1

## 2016-08-21 MED ORDER — ONDANSETRON 4 MG PO TBDP: 4.0000 mg | ORAL_TABLET | Freq: Three times a day (TID) | ORAL | 0 refills | Status: AC | PRN

## 2016-08-21 MED ORDER — IOPAMIDOL (ISOVUE-370) INJECTION 76%
75.0000 mL | Freq: Once | INTRAVENOUS | Status: AC | PRN
  Administered 2016-08-21: 75 mL via INTRAVENOUS

## 2016-08-21 MED ORDER — OXYCODONE-ACETAMINOPHEN 5-325 MG PO TABS: 1.0000 | ORAL_TABLET | Freq: Four times a day (QID) | ORAL | 0 refills | Status: AC | PRN

## 2016-08-21 NOTE — ED Triage Notes (Signed)
[facility] left side CP that radiates to left arm with dizziness and lightheadedness that began yesterday. Pt reports pain is worse with deep breath. Pt speaking in complete sentences without difficulty. No apparent distress in triage.

## 2016-08-21 NOTE — ED Notes (Signed)
Patient transported to CT 

## 2016-08-21 NOTE — ED Provider Notes (Signed)
[facility] Emergency Department Provider Note  Time seen: 10:47 AM  I have reviewed the triage vital signs and the nursing notes.   HISTORY  Chief Complaint Chest Pain    HPI Linda Lyons is a 46 y.o. female with a past medical history of anxiety, hypertension, hyperlipidemia, presents to the emergency department with left chest discomfort. According to the patient for the past 2 days she has been expressing pain in the left side of her chest worse with deep inspiration. Denies any cough or congestion. Denies leg pain or swelling. States nausea which she has been using Zofran that she had left over from a prior illness. Denies shortness of breath. Denies diaphoresis. Patient denies any cardiac history. Denies any history of blood clots. Denies any estrogen use. Currently describes her discomfort as mild as long she does not take a deep inspiration, moderate to severe with deep inspiration.  Past Medical History:  Diagnosis Date  . Allergic state   . Anxiety   . Asthma   . Eczema   . Endometriosis   . Erosive esophagitis   . Erosive gastritis   . Headache   . HH (hiatus hernia)   . Hyperlipemia   . Hyperlipidemia   . Hypertension     Patient Active Problem List   Diagnosis Date Noted  . Paraesophageal hernia 09/05/2015    Past Surgical History:  Procedure Laterality Date  . ABDOMINAL HYSTERECTOMY    . BLADDER SURGERY    . CYST EXCISION Right    rt. wrist  . ESOPHAGOGASTRODUODENOSCOPY (EGD) WITH PROPOFOL N/A 02/13/2015   Procedure: ESOPHAGOGASTRODUODENOSCOPY (EGD) WITH PROPOFOL;  Surgeon: Christena Deem, MD;  Location: [facility] ENDOSCOPY;  Service: Endoscopy;  Laterality: N/A;  . ESOPHAGOGASTRODUODENOSCOPY (EGD) WITH PROPOFOL N/A 08/01/2015   Procedure: ESOPHAGOGASTRODUODENOSCOPY (EGD) WITH PROPOFOL;  Surgeon: Christena Deem, MD;  Location: [facility] ENDOSCOPY;  Service: Endoscopy;  Laterality: N/A;    Prior to Admission medications    Medication Sig Start Date End Date Taking? Authorizing Provider  amLODipine (NORVASC) 5 MG tablet Take 5 mg by mouth daily.   Yes Historical Provider, MD  calcium carbonate (OS-CAL) 600 MG TABS tablet Take 600 mg by mouth 2 (two) times daily with a meal.   Yes Historical Provider, MD  chlorzoxazone (PARAFON) 500 MG tablet Take 500 mg by mouth 4 (four) times daily as needed for muscle spasms.   Yes Historical Provider, MD  EPINEPHrine 0.3 mg/0.3 mL IJ SOAJ injection Inject into the muscle once.   Yes Historical Provider, MD  Fluticasone-Salmeterol (ADVAIR) 100-50 MCG/DOSE AEPB Inhale 1 puff into the lungs 2 (two) times daily.   Yes Historical Provider, MD  gabapentin (NEURONTIN) 300 MG capsule Take 300 mg by mouth 2 (two) times daily. 2 caps qam and 2 caps qpm   Yes Historical Provider, MD  loratadine (CLARITIN) 10 MG tablet Take 10 mg by mouth daily.   Yes Historical Provider, MD  meloxicam (MOBIC) 15 MG tablet Take 1 tablet (15 mg total) by mouth daily. Do not take w/motrin or alleve 04/22/15  Yes Hassan Rowan, MD  metoprolol succinate (TOPROL-XL) 100 MG 24 hr tablet Take 100 mg by mouth daily. Take with or immediately following a meal.   Yes Historical Provider, MD  montelukast (SINGULAIR) 10 MG tablet Take 10 mg by mouth daily.    Yes Historical Provider, MD  ranitidine (ZANTAC) 150 MG capsule Take 150 mg by mouth daily.    Yes Historical Provider, MD  rizatriptan (MAXALT)  10 MG tablet Take 10 mg by mouth as needed for migraine. May repeat in 2 hours if needed   Yes Historical Provider, MD  venlafaxine (EFFEXOR) 75 MG tablet Take 75 mg by mouth 2 (two) times daily. Take 150 mg. By mouth 2 times a day   Yes Historical Provider, MD  chlorpheniramine-HYDROcodone (TUSSIONEX PENNKINETIC ER) 10-8 MG/5ML SUER Take 5 mLs by mouth every 12 (twelve) hours as needed for cough. Patient not taking: Reported on 08/21/2016 04/22/15   Hassan RowanEugene Wade, MD  omeprazole (PRILOSEC) 20 MG capsule Take 20 mg by mouth daily.     Historical Provider, MD  pantoprazole (PROTONIX) 40 MG tablet Take 40 mg by mouth daily.    Historical Provider, MD  predniSONE (STERAPRED UNI-PAK 21 TAB) 10 MG (21) TBPK tablet Sig 6 tablet day 1, 5 tablets day 2, 4 tablets day 3,,3tablets day 4, 2 tablets day 5, 1 tablet day 6 take all tablets orally Patient not taking: Reported on 08/21/2016 04/22/15   Hassan RowanEugene Wade, MD    Allergies  Allergen Reactions  . Estrogens     High bp  . Lisinopril     Ended up in er and she can't remember why  . Sulfacetamide Sodium Hives    Family History  Problem Relation Age of Onset  . Breast cancer Mother 4762  . Breast cancer Maternal Aunt 3268    Social History Social History  Substance Use Topics  . Smoking status: Never Smoker  . Smokeless tobacco: Not on file  . Alcohol use No    Review of Systems Constitutional: Negative for fever. Cardiovascular: Left anterior chest pain worse with inspiration. Respiratory: Negative for shortness of breath. Negative for cough. Gastrointestinal: Negative for abdominal pain Neurological: Negative for headache 10-point ROS otherwise negative.  ____________________________________________   PHYSICAL EXAM:  VITAL SIGNS: ED Triage Vitals [08/21/16 0954]  Enc Vitals Group     BP (!) 140/94     Pulse Rate (!) 103     Resp 18     Temp 98.1 F (36.7 C)     Temp Source Oral     SpO2 98 %     Weight 170 lb (77.1 kg)     Height 5\' 4"  (1.626 m)     Head Circumference      Peak Flow      Pain Score 7     Pain Loc      Pain Edu?      Excl. in GC?     Constitutional: Alert and oriented. Well appearing and in no distress. Eyes: Normal exam ENT   Head: Normocephalic and atraumatic.   Mouth/Throat: Mucous membranes are moist. Cardiovascular: Normal rate, regular rhythm. No murmur Respiratory: Normal respiratory effort without tachypnea nor retractions. Breath sounds are clear Gastrointestinal: Soft and nontender. No distention.   Musculoskeletal: Nontender with normal range of motion in all extremities. No lower extremity tenderness or edema. Neurologic:  Normal speech and language. No gross focal neurologic deficits Skin:  Skin is warm, dry. Does have a very small area approximately 1-1/2 cm diameter to her left shoulder consistent with shingles. Patient states recurrent shingles 4-5 times. Does not take antivirals for her eruptions. Could possibly be related although given no anterior chest involvement less likely. Psychiatric: Mood and affect are normal.  ____________________________________________    EKG  EKG reviewed and interpreted by myself shows sinus tachycardia 101 bpm, narrow QRS, left axis deviation, normal intervals, no concerning ST changes.  ____________________________________________  RADIOLOGY  Chest x-ray shows small left pleural effusion largely unchanged from prior CT.  ____________________________________________   INITIAL IMPRESSION / ASSESSMENT AND PLAN / ED COURSE  Pertinent labs & imaging results that were available during my care of the patient were reviewed by me and considered in my medical decision making (see chart for details).  Patient presents the emergency department left-sided chest discomfort worse with inspiration. Non-reproducible on exam. No leg pain or tenderness/swelling identified. Patient has no obvious risk factors for blood clot however given the patient's pleuritic nature of her chest pain along with mild tachycardia we'll proceed with a CT angiography of the chest to rule out pulmonary embolism.  CT negative for PE but does show pneumonia. Small effusion which could be loculated. We will treat with Levaquin have the patient follow-up with her primary care doctor. Discussed the need for repeat imaging in 4 weeks to ensure resolution. Discussed return precautions. ____________________________________________   FINAL CLINICAL IMPRESSION(S) / ED  DIAGNOSES  Left chest pain Left pneumonia   Minna Antis, MD 08/21/16 [phone]

## 2016-08-23 DIAGNOSIS — J301 Allergic rhinitis due to pollen: Secondary | ICD-10-CM | POA: Diagnosis not present

## 2016-08-23 DIAGNOSIS — J4520 Mild intermittent asthma, uncomplicated: Secondary | ICD-10-CM | POA: Diagnosis not present

## 2016-08-23 DIAGNOSIS — K219 Gastro-esophageal reflux disease without esophagitis: Secondary | ICD-10-CM | POA: Diagnosis not present

## 2016-09-02 DIAGNOSIS — R030 Elevated blood-pressure reading, without diagnosis of hypertension: Secondary | ICD-10-CM | POA: Diagnosis not present

## 2016-09-02 DIAGNOSIS — G43009 Migraine without aura, not intractable, without status migrainosus: Secondary | ICD-10-CM | POA: Diagnosis not present

## 2016-09-02 DIAGNOSIS — G44219 Episodic tension-type headache, not intractable: Secondary | ICD-10-CM | POA: Diagnosis not present

## 2016-09-03 DIAGNOSIS — J3081 Allergic rhinitis due to animal (cat) (dog) hair and dander: Secondary | ICD-10-CM | POA: Diagnosis not present

## 2016-09-03 DIAGNOSIS — J301 Allergic rhinitis due to pollen: Secondary | ICD-10-CM | POA: Diagnosis not present

## 2016-09-03 DIAGNOSIS — J3089 Other allergic rhinitis: Secondary | ICD-10-CM | POA: Diagnosis not present

## 2016-09-06 DIAGNOSIS — J3089 Other allergic rhinitis: Secondary | ICD-10-CM | POA: Diagnosis not present

## 2016-09-06 DIAGNOSIS — J301 Allergic rhinitis due to pollen: Secondary | ICD-10-CM | POA: Diagnosis not present

## 2016-09-06 DIAGNOSIS — J3081 Allergic rhinitis due to animal (cat) (dog) hair and dander: Secondary | ICD-10-CM | POA: Diagnosis not present

## 2016-09-17 DIAGNOSIS — J301 Allergic rhinitis due to pollen: Secondary | ICD-10-CM | POA: Diagnosis not present

## 2016-09-17 DIAGNOSIS — J3081 Allergic rhinitis due to animal (cat) (dog) hair and dander: Secondary | ICD-10-CM | POA: Diagnosis not present

## 2016-09-17 DIAGNOSIS — J3089 Other allergic rhinitis: Secondary | ICD-10-CM | POA: Diagnosis not present

## 2016-09-23 DIAGNOSIS — R0789 Other chest pain: Secondary | ICD-10-CM | POA: Diagnosis not present

## 2016-09-23 DIAGNOSIS — J9811 Atelectasis: Secondary | ICD-10-CM | POA: Diagnosis not present

## 2016-09-23 DIAGNOSIS — J90 Pleural effusion, not elsewhere classified: Secondary | ICD-10-CM | POA: Diagnosis not present

## 2016-09-23 DIAGNOSIS — J984 Other disorders of lung: Secondary | ICD-10-CM | POA: Diagnosis not present

## 2016-09-26 DIAGNOSIS — J301 Allergic rhinitis due to pollen: Secondary | ICD-10-CM | POA: Diagnosis not present

## 2016-09-26 DIAGNOSIS — J3081 Allergic rhinitis due to animal (cat) (dog) hair and dander: Secondary | ICD-10-CM | POA: Diagnosis not present

## 2016-09-26 DIAGNOSIS — J3089 Other allergic rhinitis: Secondary | ICD-10-CM | POA: Diagnosis not present

## 2016-10-04 DIAGNOSIS — J3089 Other allergic rhinitis: Secondary | ICD-10-CM | POA: Diagnosis not present

## 2016-10-04 DIAGNOSIS — J301 Allergic rhinitis due to pollen: Secondary | ICD-10-CM | POA: Diagnosis not present

## 2016-10-04 DIAGNOSIS — J3081 Allergic rhinitis due to animal (cat) (dog) hair and dander: Secondary | ICD-10-CM | POA: Diagnosis not present

## 2016-10-15 DIAGNOSIS — J3081 Allergic rhinitis due to animal (cat) (dog) hair and dander: Secondary | ICD-10-CM | POA: Diagnosis not present

## 2016-10-15 DIAGNOSIS — J3089 Other allergic rhinitis: Secondary | ICD-10-CM | POA: Diagnosis not present

## 2016-10-15 DIAGNOSIS — J301 Allergic rhinitis due to pollen: Secondary | ICD-10-CM | POA: Diagnosis not present

## 2016-10-18 DIAGNOSIS — Z0000 Encounter for general adult medical examination without abnormal findings: Secondary | ICD-10-CM | POA: Diagnosis not present

## 2016-10-18 DIAGNOSIS — I10 Essential (primary) hypertension: Secondary | ICD-10-CM | POA: Diagnosis not present

## 2016-10-18 DIAGNOSIS — E7800 Pure hypercholesterolemia, unspecified: Secondary | ICD-10-CM | POA: Diagnosis not present

## 2016-10-29 DIAGNOSIS — J3089 Other allergic rhinitis: Secondary | ICD-10-CM | POA: Diagnosis not present

## 2016-10-29 DIAGNOSIS — J3081 Allergic rhinitis due to animal (cat) (dog) hair and dander: Secondary | ICD-10-CM | POA: Diagnosis not present

## 2016-10-29 DIAGNOSIS — J301 Allergic rhinitis due to pollen: Secondary | ICD-10-CM | POA: Diagnosis not present

## 2016-11-04 ENCOUNTER — Other Ambulatory Visit: Payer: Self-pay | Admitting: Family Medicine

## 2016-11-04 DIAGNOSIS — Z1231 Encounter for screening mammogram for malignant neoplasm of breast: Secondary | ICD-10-CM

## 2016-11-07 DIAGNOSIS — J301 Allergic rhinitis due to pollen: Secondary | ICD-10-CM | POA: Diagnosis not present

## 2016-11-07 DIAGNOSIS — J3081 Allergic rhinitis due to animal (cat) (dog) hair and dander: Secondary | ICD-10-CM | POA: Diagnosis not present

## 2016-11-07 DIAGNOSIS — J3089 Other allergic rhinitis: Secondary | ICD-10-CM | POA: Diagnosis not present

## 2016-11-19 ENCOUNTER — Inpatient Hospital Stay: Admission: RE | Admit: 2016-11-19 | Payer: 59 | Source: Ambulatory Visit

## 2016-11-21 DIAGNOSIS — J3089 Other allergic rhinitis: Secondary | ICD-10-CM | POA: Diagnosis not present

## 2016-11-21 DIAGNOSIS — J3081 Allergic rhinitis due to animal (cat) (dog) hair and dander: Secondary | ICD-10-CM | POA: Diagnosis not present

## 2016-11-21 DIAGNOSIS — J301 Allergic rhinitis due to pollen: Secondary | ICD-10-CM | POA: Diagnosis not present

## 2016-11-25 ENCOUNTER — Ambulatory Visit
Admission: RE | Admit: 2016-11-25 | Discharge: 2016-11-25 | Disposition: A | Discharge status: Home / Self Care | Payer: 59 | Source: Ambulatory Visit | Attending: Family Medicine | Admitting: Family Medicine

## 2016-11-25 DIAGNOSIS — Z1231 Encounter for screening mammogram for malignant neoplasm of breast: Secondary | ICD-10-CM | POA: Insufficient documentation

## 2016-11-26 DIAGNOSIS — J3089 Other allergic rhinitis: Secondary | ICD-10-CM | POA: Diagnosis not present

## 2016-11-26 DIAGNOSIS — J301 Allergic rhinitis due to pollen: Secondary | ICD-10-CM | POA: Diagnosis not present

## 2016-11-26 DIAGNOSIS — J3081 Allergic rhinitis due to animal (cat) (dog) hair and dander: Secondary | ICD-10-CM | POA: Diagnosis not present

## 2016-11-29 DIAGNOSIS — J301 Allergic rhinitis due to pollen: Secondary | ICD-10-CM | POA: Diagnosis not present

## 2016-11-29 DIAGNOSIS — J3089 Other allergic rhinitis: Secondary | ICD-10-CM | POA: Diagnosis not present

## 2016-11-29 DIAGNOSIS — J3081 Allergic rhinitis due to animal (cat) (dog) hair and dander: Secondary | ICD-10-CM | POA: Diagnosis not present

## 2016-12-05 DIAGNOSIS — J3089 Other allergic rhinitis: Secondary | ICD-10-CM | POA: Diagnosis not present

## 2016-12-05 DIAGNOSIS — J3081 Allergic rhinitis due to animal (cat) (dog) hair and dander: Secondary | ICD-10-CM | POA: Diagnosis not present

## 2016-12-05 DIAGNOSIS — J301 Allergic rhinitis due to pollen: Secondary | ICD-10-CM | POA: Diagnosis not present

## 2016-12-10 DIAGNOSIS — J301 Allergic rhinitis due to pollen: Secondary | ICD-10-CM | POA: Diagnosis not present

## 2016-12-10 DIAGNOSIS — J3081 Allergic rhinitis due to animal (cat) (dog) hair and dander: Secondary | ICD-10-CM | POA: Diagnosis not present

## 2016-12-10 DIAGNOSIS — J3089 Other allergic rhinitis: Secondary | ICD-10-CM | POA: Diagnosis not present

## 2016-12-13 DIAGNOSIS — J301 Allergic rhinitis due to pollen: Secondary | ICD-10-CM | POA: Diagnosis not present

## 2016-12-13 DIAGNOSIS — J3089 Other allergic rhinitis: Secondary | ICD-10-CM | POA: Diagnosis not present

## 2016-12-13 DIAGNOSIS — J3081 Allergic rhinitis due to animal (cat) (dog) hair and dander: Secondary | ICD-10-CM | POA: Diagnosis not present

## 2016-12-31 DIAGNOSIS — J301 Allergic rhinitis due to pollen: Secondary | ICD-10-CM | POA: Diagnosis not present

## 2016-12-31 DIAGNOSIS — G44219 Episodic tension-type headache, not intractable: Secondary | ICD-10-CM | POA: Diagnosis not present

## 2016-12-31 DIAGNOSIS — G43019 Migraine without aura, intractable, without status migrainosus: Secondary | ICD-10-CM | POA: Diagnosis not present

## 2016-12-31 DIAGNOSIS — J3081 Allergic rhinitis due to animal (cat) (dog) hair and dander: Secondary | ICD-10-CM | POA: Diagnosis not present

## 2016-12-31 DIAGNOSIS — J3089 Other allergic rhinitis: Secondary | ICD-10-CM | POA: Diagnosis not present

## 2017-01-09 DIAGNOSIS — J301 Allergic rhinitis due to pollen: Secondary | ICD-10-CM | POA: Diagnosis not present

## 2017-01-09 DIAGNOSIS — J3081 Allergic rhinitis due to animal (cat) (dog) hair and dander: Secondary | ICD-10-CM | POA: Diagnosis not present

## 2017-01-09 DIAGNOSIS — J3089 Other allergic rhinitis: Secondary | ICD-10-CM | POA: Diagnosis not present

## 2017-01-14 DIAGNOSIS — J301 Allergic rhinitis due to pollen: Secondary | ICD-10-CM | POA: Diagnosis not present

## 2017-01-14 DIAGNOSIS — J3081 Allergic rhinitis due to animal (cat) (dog) hair and dander: Secondary | ICD-10-CM | POA: Diagnosis not present

## 2017-01-14 DIAGNOSIS — J3089 Other allergic rhinitis: Secondary | ICD-10-CM | POA: Diagnosis not present

## 2017-01-28 DIAGNOSIS — J3081 Allergic rhinitis due to animal (cat) (dog) hair and dander: Secondary | ICD-10-CM | POA: Diagnosis not present

## 2017-01-28 DIAGNOSIS — J3089 Other allergic rhinitis: Secondary | ICD-10-CM | POA: Diagnosis not present

## 2017-01-28 DIAGNOSIS — J301 Allergic rhinitis due to pollen: Secondary | ICD-10-CM | POA: Diagnosis not present

## 2017-01-31 DIAGNOSIS — J301 Allergic rhinitis due to pollen: Secondary | ICD-10-CM | POA: Diagnosis not present

## 2017-01-31 DIAGNOSIS — J3089 Other allergic rhinitis: Secondary | ICD-10-CM | POA: Diagnosis not present

## 2017-01-31 DIAGNOSIS — J3081 Allergic rhinitis due to animal (cat) (dog) hair and dander: Secondary | ICD-10-CM | POA: Diagnosis not present

## 2017-02-06 DIAGNOSIS — J301 Allergic rhinitis due to pollen: Secondary | ICD-10-CM | POA: Diagnosis not present

## 2017-02-06 DIAGNOSIS — J3081 Allergic rhinitis due to animal (cat) (dog) hair and dander: Secondary | ICD-10-CM | POA: Diagnosis not present

## 2017-02-06 DIAGNOSIS — J3089 Other allergic rhinitis: Secondary | ICD-10-CM | POA: Diagnosis not present

## 2017-02-06 DIAGNOSIS — R51 Headache: Secondary | ICD-10-CM | POA: Diagnosis not present

## 2017-03-05 DIAGNOSIS — G43719 Chronic migraine without aura, intractable, without status migrainosus: Secondary | ICD-10-CM | POA: Diagnosis not present

## 2017-03-20 DIAGNOSIS — J3081 Allergic rhinitis due to animal (cat) (dog) hair and dander: Secondary | ICD-10-CM | POA: Diagnosis not present

## 2017-03-20 DIAGNOSIS — J301 Allergic rhinitis due to pollen: Secondary | ICD-10-CM | POA: Diagnosis not present

## 2017-03-20 DIAGNOSIS — J3089 Other allergic rhinitis: Secondary | ICD-10-CM | POA: Diagnosis not present

## 2017-03-25 DIAGNOSIS — J3089 Other allergic rhinitis: Secondary | ICD-10-CM | POA: Diagnosis not present

## 2017-03-25 DIAGNOSIS — J301 Allergic rhinitis due to pollen: Secondary | ICD-10-CM | POA: Diagnosis not present

## 2017-03-25 DIAGNOSIS — J3081 Allergic rhinitis due to animal (cat) (dog) hair and dander: Secondary | ICD-10-CM | POA: Diagnosis not present

## 2017-03-27 DIAGNOSIS — J3089 Other allergic rhinitis: Secondary | ICD-10-CM | POA: Diagnosis not present

## 2017-03-27 DIAGNOSIS — J4530 Mild persistent asthma, uncomplicated: Secondary | ICD-10-CM | POA: Diagnosis not present

## 2017-03-27 DIAGNOSIS — J3081 Allergic rhinitis due to animal (cat) (dog) hair and dander: Secondary | ICD-10-CM | POA: Diagnosis not present

## 2017-03-27 DIAGNOSIS — J301 Allergic rhinitis due to pollen: Secondary | ICD-10-CM | POA: Diagnosis not present

## 2017-03-27 DIAGNOSIS — L501 Idiopathic urticaria: Secondary | ICD-10-CM | POA: Diagnosis not present

## 2017-03-31 DIAGNOSIS — J301 Allergic rhinitis due to pollen: Secondary | ICD-10-CM | POA: Diagnosis not present

## 2017-04-01 DIAGNOSIS — J3089 Other allergic rhinitis: Secondary | ICD-10-CM | POA: Diagnosis not present

## 2017-04-01 DIAGNOSIS — J3081 Allergic rhinitis due to animal (cat) (dog) hair and dander: Secondary | ICD-10-CM | POA: Diagnosis not present

## 2017-04-02 DIAGNOSIS — G43719 Chronic migraine without aura, intractable, without status migrainosus: Secondary | ICD-10-CM | POA: Diagnosis not present

## 2017-04-08 DIAGNOSIS — J3089 Other allergic rhinitis: Secondary | ICD-10-CM | POA: Diagnosis not present

## 2017-04-08 DIAGNOSIS — J3081 Allergic rhinitis due to animal (cat) (dog) hair and dander: Secondary | ICD-10-CM | POA: Diagnosis not present

## 2017-04-08 DIAGNOSIS — J301 Allergic rhinitis due to pollen: Secondary | ICD-10-CM | POA: Diagnosis not present

## 2017-04-11 DIAGNOSIS — J3089 Other allergic rhinitis: Secondary | ICD-10-CM | POA: Diagnosis not present

## 2017-04-11 DIAGNOSIS — J3081 Allergic rhinitis due to animal (cat) (dog) hair and dander: Secondary | ICD-10-CM | POA: Diagnosis not present

## 2017-04-11 DIAGNOSIS — J301 Allergic rhinitis due to pollen: Secondary | ICD-10-CM | POA: Diagnosis not present

## 2017-04-17 DIAGNOSIS — J3081 Allergic rhinitis due to animal (cat) (dog) hair and dander: Secondary | ICD-10-CM | POA: Diagnosis not present

## 2017-04-17 DIAGNOSIS — J3089 Other allergic rhinitis: Secondary | ICD-10-CM | POA: Diagnosis not present

## 2017-04-17 DIAGNOSIS — J301 Allergic rhinitis due to pollen: Secondary | ICD-10-CM | POA: Diagnosis not present

## 2017-04-22 DIAGNOSIS — J3089 Other allergic rhinitis: Secondary | ICD-10-CM | POA: Diagnosis not present

## 2017-04-22 DIAGNOSIS — J301 Allergic rhinitis due to pollen: Secondary | ICD-10-CM | POA: Diagnosis not present

## 2017-04-22 DIAGNOSIS — J3081 Allergic rhinitis due to animal (cat) (dog) hair and dander: Secondary | ICD-10-CM | POA: Diagnosis not present

## 2017-04-24 DIAGNOSIS — K219 Gastro-esophageal reflux disease without esophagitis: Secondary | ICD-10-CM | POA: Diagnosis not present

## 2017-04-24 DIAGNOSIS — Z23 Encounter for immunization: Secondary | ICD-10-CM | POA: Diagnosis not present

## 2017-04-24 DIAGNOSIS — E7800 Pure hypercholesterolemia, unspecified: Secondary | ICD-10-CM | POA: Diagnosis not present

## 2017-04-24 DIAGNOSIS — I10 Essential (primary) hypertension: Secondary | ICD-10-CM | POA: Diagnosis not present

## 2017-04-25 DIAGNOSIS — E7800 Pure hypercholesterolemia, unspecified: Secondary | ICD-10-CM | POA: Diagnosis not present

## 2017-05-01 DIAGNOSIS — J3089 Other allergic rhinitis: Secondary | ICD-10-CM | POA: Diagnosis not present

## 2017-05-01 DIAGNOSIS — J301 Allergic rhinitis due to pollen: Secondary | ICD-10-CM | POA: Diagnosis not present

## 2017-05-01 DIAGNOSIS — J3081 Allergic rhinitis due to animal (cat) (dog) hair and dander: Secondary | ICD-10-CM | POA: Diagnosis not present

## 2017-05-06 DIAGNOSIS — J3089 Other allergic rhinitis: Secondary | ICD-10-CM | POA: Diagnosis not present

## 2017-05-06 DIAGNOSIS — J301 Allergic rhinitis due to pollen: Secondary | ICD-10-CM | POA: Diagnosis not present

## 2017-05-06 DIAGNOSIS — J3081 Allergic rhinitis due to animal (cat) (dog) hair and dander: Secondary | ICD-10-CM | POA: Diagnosis not present

## 2017-05-20 DIAGNOSIS — J301 Allergic rhinitis due to pollen: Secondary | ICD-10-CM | POA: Diagnosis not present

## 2017-05-20 DIAGNOSIS — J3081 Allergic rhinitis due to animal (cat) (dog) hair and dander: Secondary | ICD-10-CM | POA: Diagnosis not present

## 2017-05-20 DIAGNOSIS — G43719 Chronic migraine without aura, intractable, without status migrainosus: Secondary | ICD-10-CM | POA: Diagnosis not present

## 2017-05-20 DIAGNOSIS — J3089 Other allergic rhinitis: Secondary | ICD-10-CM | POA: Diagnosis not present

## 2017-05-23 ENCOUNTER — Ambulatory Visit
Admission: EM | Admit: 2017-05-23 | Discharge: 2017-05-23 | Disposition: A | Discharge status: Home / Self Care | Payer: 59 | Attending: Family Medicine | Admitting: Family Medicine

## 2017-05-23 DIAGNOSIS — R05 Cough: Secondary | ICD-10-CM

## 2017-05-23 DIAGNOSIS — J069 Acute upper respiratory infection, unspecified: Secondary | ICD-10-CM

## 2017-05-23 MED ORDER — BENZONATATE 200 MG PO CAPS: ORAL_CAPSULE | ORAL | 0 refills | Status: DC

## 2017-05-23 NOTE — ED Provider Notes (Signed)
[facility]    CSN: [id number] Arrival date & time: 05/23/17  1328     History   Chief Complaint Chief Complaint  Patient presents with  . Cough    appointment    HPI Linda Lyons is a 46 y.o. female.   HPI  46 year old female the onset yesterday of chest congestion nasal congestion body ache chills.  Patient has a history of pneumonia.  She works in [place] [employer].  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  She is afebrile at 98.5 O2 sats 96% on room air.  She is concerned because she has a history of ammonia.     Past Medical History:  Diagnosis Date  . Allergic state   . Anxiety   . Asthma   . Eczema   . Endometriosis   . Erosive esophagitis   . Erosive gastritis   . Headache   . HH (hiatus hernia)   . Hyperlipemia   . Hyperlipidemia   . Hypertension     Patient Active Problem List   Diagnosis Date Noted  . Paraesophageal hernia 09/05/2015    Past Surgical History:  Procedure Laterality Date  . ABDOMINAL HYSTERECTOMY    . BLADDER SURGERY    . CYST EXCISION Right    rt. wrist  . ESOPHAGOGASTRODUODENOSCOPY (EGD) WITH PROPOFOL N/A 02/13/2015   Procedure: ESOPHAGOGASTRODUODENOSCOPY (EGD) WITH PROPOFOL;  Surgeon: Christena DeemMartin U Skulskie, MD;  [facility];  Service: Endoscopy;  Laterality: N/A;  . ESOPHAGOGASTRODUODENOSCOPY (EGD) WITH PROPOFOL N/A 08/01/2015   Procedure: ESOPHAGOGASTRODUODENOSCOPY (EGD) WITH PROPOFOL;  Surgeon: Christena DeemMartin U Skulskie, MD;  [facility];  Service: Endoscopy;  Laterality: N/A;    OB History    No data available       Home Medications    Prior to Admission medications   Medication Sig Start Date End Date Taking? Authorizing Provider  amLODipine (NORVASC) 5 MG tablet Take 5 mg by mouth daily.   Yes [provider]  calcium carbonate (OS-CAL) 600 MG TABS tablet Take 600 mg by mouth 2 (two) times daily with a meal.   Yes [provider]    chlorpheniramine-HYDROcodone (TUSSIONEX PENNKINETIC ER) 10-8 MG/5ML SUER Take 5 mLs by mouth every 12 (twelve) hours as needed for cough. 04/22/15  Yes Hassan RowanWade, Eugene, MD  chlorzoxazone (PARAFON) 500 MG tablet Take 500 mg by mouth 4 (four) times daily as needed for muscle spasms.   Yes [provider]  EPINEPHrine 0.3 mg/0.3 mL IJ SOAJ injection Inject into the muscle once.   Yes [provider]  Fluticasone-Salmeterol (ADVAIR) 100-50 MCG/DOSE AEPB Inhale 1 puff into the lungs 2 (two) times daily.   Yes [provider]  gabapentin (NEURONTIN) 300 MG capsule Take 300 mg by mouth 2 (two) times daily. 2 caps qam and 2 caps qpm   Yes [provider]  loratadine (CLARITIN) 10 MG tablet Take 10 mg by mouth daily.   Yes [provider]  meloxicam (MOBIC) 15 MG tablet Take 1 tablet (15 mg total) by mouth daily. Do not take w/motrin or alleve 04/22/15  Yes Hassan RowanWade, Eugene, MD  metoprolol succinate (TOPROL-XL) 100 MG 24 hr tablet Take 100 mg by mouth daily. Take with or immediately following a meal.   Yes [provider]  montelukast (SINGULAIR) 10 MG tablet Take 10 mg by mouth daily.    Yes [provider]  omeprazole (PRILOSEC) 20 MG capsule Take 20 mg by mouth daily.   Yes [provider]  ondansetron (ZOFRAN ODT) 4 MG disintegrating tablet Take 1 tablet (4 mg total) by mouth every 8 (eight) hours as needed for nausea or vomiting. 08/21/16  Yes Minna AntisPaduchowski, Kevin, MD  oxyCODONE-acetaminophen (ROXICET) 5-325 MG tablet Take 1 tablet by mouth every 6 (six) hours as needed. 08/21/16  Yes Minna AntisPaduchowski, Kevin, MD  pantoprazole (PROTONIX) 40 MG tablet Take 40 mg by mouth daily.   Yes [provider]  predniSONE (STERAPRED UNI-PAK 21 TAB) 10 MG (21) TBPK tablet Sig 6 tablet day 1, 5 tablets day 2, 4 tablets day 3,,3tablets day 4, 2 tablets day 5, 1 tablet day 6 take all tablets orally 04/22/15  Yes Hassan RowanWade, Eugene, MD  ranitidine (ZANTAC) 150 MG  capsule Take 150 mg by mouth daily.    Yes [provider]  rizatriptan (MAXALT) 10 MG tablet Take 10 mg by mouth as needed for migraine. May repeat in 2 hours if needed   Yes [provider]  venlafaxine (EFFEXOR) 75 MG tablet Take 75 mg by mouth 2 (two) times daily. Take 150 mg. By mouth 2 times a day   Yes [provider]  benzonatate (TESSALON) 200 MG capsule Take one cap TID PRN cough 05/23/17   Lutricia Feiloemer, William P, PA-C    Family History Family History  Problem Relation Age of Onset  . Breast cancer Mother 5462  . Breast cancer Maternal Aunt 1468    Social History Social History   Tobacco Use  . Smoking status: Never Smoker  . Smokeless tobacco: Never Used  Substance Use Topics  . Alcohol use: No  . Drug use: No     Allergies   Estrogens; Lisinopril; and Sulfacetamide sodium   Review of Systems Review of Systems  Constitutional: Positive for activity change, chills and fatigue. Negative for fever.  HENT: Positive for congestion, postnasal drip and rhinorrhea.   Respiratory: Positive for cough. Negative for shortness of breath.   All other systems reviewed and are negative.    Physical Exam Triage Vital Signs ED Triage Vitals  Enc Vitals Group     BP 05/23/17 1349 127/74     Pulse Rate 05/23/17 1349 84     Resp 05/23/17 1349 16     Temp 05/23/17 1349 98.5 F (36.9 C)     Temp Source 05/23/17 1349 Oral     SpO2 05/23/17 1349 96 %     Weight --      Height --      Head Circumference --      Peak Flow --      Pain Score 05/23/17 1348 6     Pain Loc --      Pain Edu? --      Excl. in GC? --    No data found.  Updated Vital Signs BP 127/74 (BP Location: Right Arm)   Pulse 84   Temp 98.5 F (36.9 C) (Oral)   Resp 16   SpO2 96%   Visual Acuity Right Eye Distance:   Left Eye Distance:   Bilateral Distance:    Right Eye Near:   Left Eye Near:    Bilateral Near:     Physical Exam  Constitutional: She is oriented to person,  place, and time. She appears well-developed and well-nourished. No distress.  HENT:  Head: Normocephalic.  Right Ear: External ear normal.  Left Ear: External ear normal.  Nose: Nose normal.  Mouth/Throat: Oropharynx is clear and moist. No oropharyngeal exudate.  Eyes: Pupils are equal,  round, and reactive to light. Right eye exhibits no discharge. Left eye exhibits no discharge.  Neck: Normal range of motion.  Pulmonary/Chest: Effort normal and breath sounds normal.  Musculoskeletal: Normal range of motion.  Lymphadenopathy:    She has no cervical adenopathy.  Neurological: She is alert and oriented to person, place, and time.  Skin: Skin is warm and dry. She is not diaphoretic.  Psychiatric: She has a normal mood and affect. Her behavior is normal. Judgment and thought content normal.  Nursing note and vitals reviewed.    UC Treatments / Results  Labs (all labs ordered are listed, but only abnormal results are displayed) Labs Reviewed - No data to display  EKG  EKG Interpretation None       Radiology No results found.  Procedures Procedures (including critical care time)  Medications Ordered in UC Medications - No data to display   Initial Impression / Assessment and Plan / UC Course  I have reviewed the triage vital signs and the nursing notes.  Pertinent labs & imaging results that were available during my care of the patient were reviewed by me and considered in my medical decision making (see chart for details).     Plan: 1. Test/x-ray results and diagnosis reviewed with patient 2. rx as per orders; risks, benefits, potential side effects reviewed with patient 3. Recommend supportive treatment with her Motrin for body aches and fever.  This is most likely a viral illness does not require antibiotics at this time. Will have to run it's course.  If she continues to have problems she return to our clinic or follow-up with her primary care physician. 4. F/u prn  if symptoms worsen or don't improve   Final Clinical Impressions(s) / UC Diagnoses   Final diagnoses:  Upper respiratory tract infection, unspecified type    ED Discharge Orders        Ordered    benzonatate (TESSALON) 200 MG capsule     05/23/17 1423       Controlled Substance Prescriptions [place] Controlled Substance Registry consulted? Not Applicable   Lutricia Feil, PA-C 05/23/17 2039

## 2017-05-23 NOTE — ED Triage Notes (Addendum)
As per patient onset yesterday Chest congestion, nasal congestion, body ache, chills felt warm but didn't check her temp as per patient had HX of pneumonia.

## 2017-06-12 DIAGNOSIS — J3081 Allergic rhinitis due to animal (cat) (dog) hair and dander: Secondary | ICD-10-CM | POA: Diagnosis not present

## 2017-06-12 DIAGNOSIS — J3089 Other allergic rhinitis: Secondary | ICD-10-CM | POA: Diagnosis not present

## 2017-06-12 DIAGNOSIS — J301 Allergic rhinitis due to pollen: Secondary | ICD-10-CM | POA: Diagnosis not present

## 2017-06-20 DIAGNOSIS — J3081 Allergic rhinitis due to animal (cat) (dog) hair and dander: Secondary | ICD-10-CM | POA: Diagnosis not present

## 2017-06-20 DIAGNOSIS — J3089 Other allergic rhinitis: Secondary | ICD-10-CM | POA: Diagnosis not present

## 2017-06-20 DIAGNOSIS — J301 Allergic rhinitis due to pollen: Secondary | ICD-10-CM | POA: Diagnosis not present

## 2017-06-30 DIAGNOSIS — G43719 Chronic migraine without aura, intractable, without status migrainosus: Secondary | ICD-10-CM | POA: Diagnosis not present

## 2017-07-01 DIAGNOSIS — J3081 Allergic rhinitis due to animal (cat) (dog) hair and dander: Secondary | ICD-10-CM | POA: Diagnosis not present

## 2017-07-01 DIAGNOSIS — J301 Allergic rhinitis due to pollen: Secondary | ICD-10-CM | POA: Diagnosis not present

## 2017-07-01 DIAGNOSIS — J3089 Other allergic rhinitis: Secondary | ICD-10-CM | POA: Diagnosis not present

## 2017-07-08 DIAGNOSIS — J3081 Allergic rhinitis due to animal (cat) (dog) hair and dander: Secondary | ICD-10-CM | POA: Diagnosis not present

## 2017-07-08 DIAGNOSIS — J301 Allergic rhinitis due to pollen: Secondary | ICD-10-CM | POA: Diagnosis not present

## 2017-07-08 DIAGNOSIS — J3089 Other allergic rhinitis: Secondary | ICD-10-CM | POA: Diagnosis not present

## 2017-07-10 DIAGNOSIS — J301 Allergic rhinitis due to pollen: Secondary | ICD-10-CM | POA: Diagnosis not present

## 2017-07-10 DIAGNOSIS — J3089 Other allergic rhinitis: Secondary | ICD-10-CM | POA: Diagnosis not present

## 2017-07-18 DIAGNOSIS — J301 Allergic rhinitis due to pollen: Secondary | ICD-10-CM | POA: Diagnosis not present

## 2017-07-18 DIAGNOSIS — J3089 Other allergic rhinitis: Secondary | ICD-10-CM | POA: Diagnosis not present

## 2017-07-18 DIAGNOSIS — J3081 Allergic rhinitis due to animal (cat) (dog) hair and dander: Secondary | ICD-10-CM | POA: Diagnosis not present

## 2017-07-24 DIAGNOSIS — J3089 Other allergic rhinitis: Secondary | ICD-10-CM | POA: Diagnosis not present

## 2017-07-24 DIAGNOSIS — J301 Allergic rhinitis due to pollen: Secondary | ICD-10-CM | POA: Diagnosis not present

## 2017-07-24 DIAGNOSIS — J3081 Allergic rhinitis due to animal (cat) (dog) hair and dander: Secondary | ICD-10-CM | POA: Diagnosis not present

## 2017-07-29 DIAGNOSIS — J301 Allergic rhinitis due to pollen: Secondary | ICD-10-CM | POA: Diagnosis not present

## 2017-07-29 DIAGNOSIS — G43719 Chronic migraine without aura, intractable, without status migrainosus: Secondary | ICD-10-CM | POA: Diagnosis not present

## 2017-07-29 DIAGNOSIS — J3089 Other allergic rhinitis: Secondary | ICD-10-CM | POA: Diagnosis not present

## 2017-08-01 DIAGNOSIS — J3081 Allergic rhinitis due to animal (cat) (dog) hair and dander: Secondary | ICD-10-CM | POA: Diagnosis not present

## 2017-08-01 DIAGNOSIS — J301 Allergic rhinitis due to pollen: Secondary | ICD-10-CM | POA: Diagnosis not present

## 2017-08-01 DIAGNOSIS — J3089 Other allergic rhinitis: Secondary | ICD-10-CM | POA: Diagnosis not present

## 2017-08-07 DIAGNOSIS — J3081 Allergic rhinitis due to animal (cat) (dog) hair and dander: Secondary | ICD-10-CM | POA: Diagnosis not present

## 2017-08-07 DIAGNOSIS — J3089 Other allergic rhinitis: Secondary | ICD-10-CM | POA: Diagnosis not present

## 2017-08-07 DIAGNOSIS — J301 Allergic rhinitis due to pollen: Secondary | ICD-10-CM | POA: Diagnosis not present

## 2017-08-15 DIAGNOSIS — J301 Allergic rhinitis due to pollen: Secondary | ICD-10-CM | POA: Diagnosis not present

## 2017-08-15 DIAGNOSIS — J3089 Other allergic rhinitis: Secondary | ICD-10-CM | POA: Diagnosis not present

## 2017-08-15 DIAGNOSIS — J3081 Allergic rhinitis due to animal (cat) (dog) hair and dander: Secondary | ICD-10-CM | POA: Diagnosis not present

## 2017-08-21 DIAGNOSIS — J301 Allergic rhinitis due to pollen: Secondary | ICD-10-CM | POA: Diagnosis not present

## 2017-08-21 DIAGNOSIS — J3081 Allergic rhinitis due to animal (cat) (dog) hair and dander: Secondary | ICD-10-CM | POA: Diagnosis not present

## 2017-08-21 DIAGNOSIS — J3089 Other allergic rhinitis: Secondary | ICD-10-CM | POA: Diagnosis not present

## 2017-08-26 DIAGNOSIS — J3081 Allergic rhinitis due to animal (cat) (dog) hair and dander: Secondary | ICD-10-CM | POA: Diagnosis not present

## 2017-08-26 DIAGNOSIS — G43719 Chronic migraine without aura, intractable, without status migrainosus: Secondary | ICD-10-CM | POA: Diagnosis not present

## 2017-08-26 DIAGNOSIS — J301 Allergic rhinitis due to pollen: Secondary | ICD-10-CM | POA: Diagnosis not present

## 2017-08-26 DIAGNOSIS — J3089 Other allergic rhinitis: Secondary | ICD-10-CM | POA: Diagnosis not present

## 2017-09-02 DIAGNOSIS — J301 Allergic rhinitis due to pollen: Secondary | ICD-10-CM | POA: Diagnosis not present

## 2017-09-02 DIAGNOSIS — J3089 Other allergic rhinitis: Secondary | ICD-10-CM | POA: Diagnosis not present

## 2017-09-02 DIAGNOSIS — J3081 Allergic rhinitis due to animal (cat) (dog) hair and dander: Secondary | ICD-10-CM | POA: Diagnosis not present

## 2017-09-16 DIAGNOSIS — J301 Allergic rhinitis due to pollen: Secondary | ICD-10-CM | POA: Diagnosis not present

## 2017-09-16 DIAGNOSIS — J3089 Other allergic rhinitis: Secondary | ICD-10-CM | POA: Diagnosis not present

## 2017-09-16 DIAGNOSIS — J3081 Allergic rhinitis due to animal (cat) (dog) hair and dander: Secondary | ICD-10-CM | POA: Diagnosis not present

## 2017-09-25 DIAGNOSIS — J3089 Other allergic rhinitis: Secondary | ICD-10-CM | POA: Diagnosis not present

## 2017-09-25 DIAGNOSIS — J301 Allergic rhinitis due to pollen: Secondary | ICD-10-CM | POA: Diagnosis not present

## 2017-09-25 DIAGNOSIS — J3081 Allergic rhinitis due to animal (cat) (dog) hair and dander: Secondary | ICD-10-CM | POA: Diagnosis not present

## 2017-09-29 DIAGNOSIS — G43719 Chronic migraine without aura, intractable, without status migrainosus: Secondary | ICD-10-CM | POA: Diagnosis not present

## 2017-10-03 DIAGNOSIS — J301 Allergic rhinitis due to pollen: Secondary | ICD-10-CM | POA: Diagnosis not present

## 2017-10-03 DIAGNOSIS — J3081 Allergic rhinitis due to animal (cat) (dog) hair and dander: Secondary | ICD-10-CM | POA: Diagnosis not present

## 2017-10-03 DIAGNOSIS — J3089 Other allergic rhinitis: Secondary | ICD-10-CM | POA: Diagnosis not present

## 2017-10-09 DIAGNOSIS — J3081 Allergic rhinitis due to animal (cat) (dog) hair and dander: Secondary | ICD-10-CM | POA: Diagnosis not present

## 2017-10-09 DIAGNOSIS — J301 Allergic rhinitis due to pollen: Secondary | ICD-10-CM | POA: Diagnosis not present

## 2017-10-09 DIAGNOSIS — J3089 Other allergic rhinitis: Secondary | ICD-10-CM | POA: Diagnosis not present

## 2017-10-14 DIAGNOSIS — J301 Allergic rhinitis due to pollen: Secondary | ICD-10-CM | POA: Diagnosis not present

## 2017-10-14 DIAGNOSIS — J3081 Allergic rhinitis due to animal (cat) (dog) hair and dander: Secondary | ICD-10-CM | POA: Diagnosis not present

## 2017-10-14 DIAGNOSIS — J3089 Other allergic rhinitis: Secondary | ICD-10-CM | POA: Diagnosis not present

## 2017-10-28 DIAGNOSIS — G43719 Chronic migraine without aura, intractable, without status migrainosus: Secondary | ICD-10-CM | POA: Diagnosis not present

## 2017-10-30 DIAGNOSIS — J301 Allergic rhinitis due to pollen: Secondary | ICD-10-CM | POA: Diagnosis not present

## 2017-10-30 DIAGNOSIS — J3081 Allergic rhinitis due to animal (cat) (dog) hair and dander: Secondary | ICD-10-CM | POA: Diagnosis not present

## 2017-10-30 DIAGNOSIS — J3089 Other allergic rhinitis: Secondary | ICD-10-CM | POA: Diagnosis not present

## 2017-10-31 DIAGNOSIS — K219 Gastro-esophageal reflux disease without esophagitis: Secondary | ICD-10-CM | POA: Diagnosis not present

## 2017-10-31 DIAGNOSIS — E7800 Pure hypercholesterolemia, unspecified: Secondary | ICD-10-CM | POA: Diagnosis not present

## 2017-10-31 DIAGNOSIS — I10 Essential (primary) hypertension: Secondary | ICD-10-CM | POA: Diagnosis not present

## 2017-11-03 DIAGNOSIS — E7800 Pure hypercholesterolemia, unspecified: Secondary | ICD-10-CM | POA: Diagnosis not present

## 2017-11-13 DIAGNOSIS — J3081 Allergic rhinitis due to animal (cat) (dog) hair and dander: Secondary | ICD-10-CM | POA: Diagnosis not present

## 2017-11-13 DIAGNOSIS — J301 Allergic rhinitis due to pollen: Secondary | ICD-10-CM | POA: Diagnosis not present

## 2017-11-13 DIAGNOSIS — J3089 Other allergic rhinitis: Secondary | ICD-10-CM | POA: Diagnosis not present

## 2017-11-18 DIAGNOSIS — J3081 Allergic rhinitis due to animal (cat) (dog) hair and dander: Secondary | ICD-10-CM | POA: Diagnosis not present

## 2017-11-18 DIAGNOSIS — J301 Allergic rhinitis due to pollen: Secondary | ICD-10-CM | POA: Diagnosis not present

## 2017-11-18 DIAGNOSIS — J3089 Other allergic rhinitis: Secondary | ICD-10-CM | POA: Diagnosis not present

## 2017-11-25 DIAGNOSIS — J301 Allergic rhinitis due to pollen: Secondary | ICD-10-CM | POA: Diagnosis not present

## 2017-11-25 DIAGNOSIS — J3089 Other allergic rhinitis: Secondary | ICD-10-CM | POA: Diagnosis not present

## 2017-11-25 DIAGNOSIS — J3081 Allergic rhinitis due to animal (cat) (dog) hair and dander: Secondary | ICD-10-CM | POA: Diagnosis not present

## 2017-12-04 DIAGNOSIS — J301 Allergic rhinitis due to pollen: Secondary | ICD-10-CM | POA: Diagnosis not present

## 2017-12-04 DIAGNOSIS — J4530 Mild persistent asthma, uncomplicated: Secondary | ICD-10-CM | POA: Diagnosis not present

## 2017-12-04 DIAGNOSIS — J3089 Other allergic rhinitis: Secondary | ICD-10-CM | POA: Diagnosis not present

## 2017-12-04 DIAGNOSIS — L501 Idiopathic urticaria: Secondary | ICD-10-CM | POA: Diagnosis not present

## 2017-12-05 DIAGNOSIS — J301 Allergic rhinitis due to pollen: Secondary | ICD-10-CM | POA: Diagnosis not present

## 2017-12-08 DIAGNOSIS — J3081 Allergic rhinitis due to animal (cat) (dog) hair and dander: Secondary | ICD-10-CM | POA: Diagnosis not present

## 2017-12-08 DIAGNOSIS — J3089 Other allergic rhinitis: Secondary | ICD-10-CM | POA: Diagnosis not present

## 2017-12-09 DIAGNOSIS — J3081 Allergic rhinitis due to animal (cat) (dog) hair and dander: Secondary | ICD-10-CM | POA: Diagnosis not present

## 2017-12-09 DIAGNOSIS — J301 Allergic rhinitis due to pollen: Secondary | ICD-10-CM | POA: Diagnosis not present

## 2017-12-09 DIAGNOSIS — J3089 Other allergic rhinitis: Secondary | ICD-10-CM | POA: Diagnosis not present

## 2017-12-18 DIAGNOSIS — J3081 Allergic rhinitis due to animal (cat) (dog) hair and dander: Secondary | ICD-10-CM | POA: Diagnosis not present

## 2017-12-18 DIAGNOSIS — J301 Allergic rhinitis due to pollen: Secondary | ICD-10-CM | POA: Diagnosis not present

## 2017-12-18 DIAGNOSIS — J3089 Other allergic rhinitis: Secondary | ICD-10-CM | POA: Diagnosis not present

## 2017-12-22 DIAGNOSIS — G43719 Chronic migraine without aura, intractable, without status migrainosus: Secondary | ICD-10-CM | POA: Diagnosis not present

## 2017-12-26 ENCOUNTER — Other Ambulatory Visit: Payer: Self-pay | Admitting: Family Medicine

## 2017-12-26 DIAGNOSIS — Z1231 Encounter for screening mammogram for malignant neoplasm of breast: Secondary | ICD-10-CM

## 2018-01-13 ENCOUNTER — Ambulatory Visit
Admission: RE | Admit: 2018-01-13 | Discharge: 2018-01-13 | Disposition: A | Discharge status: Home / Self Care | Payer: 59 | Source: Ambulatory Visit | Attending: Family Medicine | Admitting: Family Medicine

## 2018-01-13 DIAGNOSIS — J3081 Allergic rhinitis due to animal (cat) (dog) hair and dander: Secondary | ICD-10-CM | POA: Diagnosis not present

## 2018-01-13 DIAGNOSIS — J301 Allergic rhinitis due to pollen: Secondary | ICD-10-CM | POA: Diagnosis not present

## 2018-01-13 DIAGNOSIS — Z1231 Encounter for screening mammogram for malignant neoplasm of breast: Secondary | ICD-10-CM | POA: Insufficient documentation

## 2018-01-13 DIAGNOSIS — J3089 Other allergic rhinitis: Secondary | ICD-10-CM | POA: Diagnosis not present

## 2018-01-22 DIAGNOSIS — G43719 Chronic migraine without aura, intractable, without status migrainosus: Secondary | ICD-10-CM | POA: Diagnosis not present

## 2018-01-27 DIAGNOSIS — J3081 Allergic rhinitis due to animal (cat) (dog) hair and dander: Secondary | ICD-10-CM | POA: Diagnosis not present

## 2018-01-27 DIAGNOSIS — J301 Allergic rhinitis due to pollen: Secondary | ICD-10-CM | POA: Diagnosis not present

## 2018-01-27 DIAGNOSIS — J3089 Other allergic rhinitis: Secondary | ICD-10-CM | POA: Diagnosis not present

## 2018-01-30 DIAGNOSIS — J3089 Other allergic rhinitis: Secondary | ICD-10-CM | POA: Diagnosis not present

## 2018-01-30 DIAGNOSIS — J3081 Allergic rhinitis due to animal (cat) (dog) hair and dander: Secondary | ICD-10-CM | POA: Diagnosis not present

## 2018-01-30 DIAGNOSIS — J301 Allergic rhinitis due to pollen: Secondary | ICD-10-CM | POA: Diagnosis not present

## 2018-02-05 DIAGNOSIS — J3081 Allergic rhinitis due to animal (cat) (dog) hair and dander: Secondary | ICD-10-CM | POA: Diagnosis not present

## 2018-02-05 DIAGNOSIS — J301 Allergic rhinitis due to pollen: Secondary | ICD-10-CM | POA: Diagnosis not present

## 2018-02-05 DIAGNOSIS — J3089 Other allergic rhinitis: Secondary | ICD-10-CM | POA: Diagnosis not present

## 2018-02-10 DIAGNOSIS — J3081 Allergic rhinitis due to animal (cat) (dog) hair and dander: Secondary | ICD-10-CM | POA: Diagnosis not present

## 2018-02-10 DIAGNOSIS — J301 Allergic rhinitis due to pollen: Secondary | ICD-10-CM | POA: Diagnosis not present

## 2018-02-10 DIAGNOSIS — J3089 Other allergic rhinitis: Secondary | ICD-10-CM | POA: Diagnosis not present

## 2018-02-17 DIAGNOSIS — J3089 Other allergic rhinitis: Secondary | ICD-10-CM | POA: Diagnosis not present

## 2018-02-17 DIAGNOSIS — J3081 Allergic rhinitis due to animal (cat) (dog) hair and dander: Secondary | ICD-10-CM | POA: Diagnosis not present

## 2018-02-17 DIAGNOSIS — J301 Allergic rhinitis due to pollen: Secondary | ICD-10-CM | POA: Diagnosis not present

## 2018-02-19 DIAGNOSIS — J3081 Allergic rhinitis due to animal (cat) (dog) hair and dander: Secondary | ICD-10-CM | POA: Diagnosis not present

## 2018-02-19 DIAGNOSIS — J301 Allergic rhinitis due to pollen: Secondary | ICD-10-CM | POA: Diagnosis not present

## 2018-02-19 DIAGNOSIS — J3089 Other allergic rhinitis: Secondary | ICD-10-CM | POA: Diagnosis not present

## 2018-02-24 DIAGNOSIS — J3089 Other allergic rhinitis: Secondary | ICD-10-CM | POA: Diagnosis not present

## 2018-02-24 DIAGNOSIS — J3081 Allergic rhinitis due to animal (cat) (dog) hair and dander: Secondary | ICD-10-CM | POA: Diagnosis not present

## 2018-02-24 DIAGNOSIS — J301 Allergic rhinitis due to pollen: Secondary | ICD-10-CM | POA: Diagnosis not present

## 2018-02-25 DIAGNOSIS — G5761 Lesion of plantar nerve, right lower limb: Secondary | ICD-10-CM | POA: Diagnosis not present

## 2018-02-25 DIAGNOSIS — M79671 Pain in right foot: Secondary | ICD-10-CM | POA: Diagnosis not present

## 2018-03-05 DIAGNOSIS — J3089 Other allergic rhinitis: Secondary | ICD-10-CM | POA: Diagnosis not present

## 2018-03-05 DIAGNOSIS — J3081 Allergic rhinitis due to animal (cat) (dog) hair and dander: Secondary | ICD-10-CM | POA: Diagnosis not present

## 2018-03-05 DIAGNOSIS — J301 Allergic rhinitis due to pollen: Secondary | ICD-10-CM | POA: Diagnosis not present

## 2018-03-10 DIAGNOSIS — J3089 Other allergic rhinitis: Secondary | ICD-10-CM | POA: Diagnosis not present

## 2018-03-10 DIAGNOSIS — J301 Allergic rhinitis due to pollen: Secondary | ICD-10-CM | POA: Diagnosis not present

## 2018-03-10 DIAGNOSIS — J3081 Allergic rhinitis due to animal (cat) (dog) hair and dander: Secondary | ICD-10-CM | POA: Diagnosis not present

## 2018-03-18 DIAGNOSIS — M79671 Pain in right foot: Secondary | ICD-10-CM | POA: Diagnosis not present

## 2018-03-18 DIAGNOSIS — G5761 Lesion of plantar nerve, right lower limb: Secondary | ICD-10-CM | POA: Diagnosis not present

## 2018-03-19 DIAGNOSIS — J3089 Other allergic rhinitis: Secondary | ICD-10-CM | POA: Diagnosis not present

## 2018-03-19 DIAGNOSIS — J3081 Allergic rhinitis due to animal (cat) (dog) hair and dander: Secondary | ICD-10-CM | POA: Diagnosis not present

## 2018-03-19 DIAGNOSIS — J301 Allergic rhinitis due to pollen: Secondary | ICD-10-CM | POA: Diagnosis not present

## 2018-04-03 DIAGNOSIS — J3089 Other allergic rhinitis: Secondary | ICD-10-CM | POA: Diagnosis not present

## 2018-04-03 DIAGNOSIS — J3081 Allergic rhinitis due to animal (cat) (dog) hair and dander: Secondary | ICD-10-CM | POA: Diagnosis not present

## 2018-04-03 DIAGNOSIS — J301 Allergic rhinitis due to pollen: Secondary | ICD-10-CM | POA: Diagnosis not present

## 2018-04-09 DIAGNOSIS — J3081 Allergic rhinitis due to animal (cat) (dog) hair and dander: Secondary | ICD-10-CM | POA: Diagnosis not present

## 2018-04-09 DIAGNOSIS — J3089 Other allergic rhinitis: Secondary | ICD-10-CM | POA: Diagnosis not present

## 2018-04-09 DIAGNOSIS — J301 Allergic rhinitis due to pollen: Secondary | ICD-10-CM | POA: Diagnosis not present

## 2018-04-16 DIAGNOSIS — J301 Allergic rhinitis due to pollen: Secondary | ICD-10-CM | POA: Diagnosis not present

## 2018-04-16 DIAGNOSIS — J3081 Allergic rhinitis due to animal (cat) (dog) hair and dander: Secondary | ICD-10-CM | POA: Diagnosis not present

## 2018-04-16 DIAGNOSIS — J3089 Other allergic rhinitis: Secondary | ICD-10-CM | POA: Diagnosis not present

## 2018-04-27 DIAGNOSIS — G5761 Lesion of plantar nerve, right lower limb: Secondary | ICD-10-CM | POA: Diagnosis not present

## 2018-04-27 DIAGNOSIS — M79671 Pain in right foot: Secondary | ICD-10-CM | POA: Diagnosis not present

## 2018-04-28 ENCOUNTER — Other Ambulatory Visit: Payer: Self-pay | Admitting: Podiatry

## 2018-04-29 DIAGNOSIS — R51 Headache: Secondary | ICD-10-CM | POA: Diagnosis not present

## 2018-04-29 NOTE — Anesthesia Preprocedure Evaluation (Addendum)
Anesthesia Evaluation  Patient identified by MRN, date of birth, ID band Patient awake    Reviewed: Allergy & Precautions, NPO status , Patient's Chart, lab work & pertinent test results  History of Anesthesia Complications (+) PONV and history of anesthetic complications  Airway Mallampati: I  TM Distance: >3 FB Neck ROM: Full    Dental no notable dental hx.    Pulmonary asthma ,    Pulmonary exam normal breath sounds clear to auscultation       Cardiovascular Exercise Tolerance: Good hypertension, Normal cardiovascular exam Rhythm:Regular Rate:Normal     Neuro/Psych  Headaches, PSYCHIATRIC DISORDERS Anxiety    GI/Hepatic hiatal hernia, GERD  Controlled,Erosive esophagitis   Endo/Other  negative endocrine ROS  Renal/GU negative Renal ROS     Musculoskeletal   Abdominal   Peds  Hematology negative hematology ROS (+)   Anesthesia Other Findings   Reproductive/Obstetrics                            Anesthesia Physical Anesthesia Plan  ASA: II  Anesthesia Plan: General   Post-op Pain Management:    Induction: Intravenous  PONV Risk Score and Plan: 3 and TIVA and Propofol infusion  Airway Management Planned: Natural Airway  Additional Equipment:   Intra-op Plan:   Post-operative Plan:   Informed Consent: I have reviewed the patients History and Physical, chart, labs and discussed the procedure including the risks, benefits and alternatives for the proposed anesthesia with the patient or authorized representative who has indicated his/her understanding and acceptance.     Plan Discussed with: CRNA  Anesthesia Plan Comments:        Anesthesia Quick Evaluation

## 2018-04-30 DIAGNOSIS — J301 Allergic rhinitis due to pollen: Secondary | ICD-10-CM | POA: Diagnosis not present

## 2018-04-30 DIAGNOSIS — J3089 Other allergic rhinitis: Secondary | ICD-10-CM | POA: Diagnosis not present

## 2018-04-30 DIAGNOSIS — J3081 Allergic rhinitis due to animal (cat) (dog) hair and dander: Secondary | ICD-10-CM | POA: Diagnosis not present

## 2018-05-04 NOTE — Discharge Instructions (Signed)
[facility] °[facility] ° °POST OPERATIVE INSTRUCTIONS FOR DR. TROXLER AND DR. FOWLER °[facility] ° ° °1. Take your medication as prescribed.  Pain medication should be taken only as needed. ° °2. Keep the dressing clean, dry and intact. ° °3. Keep your foot elevated above the heart level for the first 48 hours. ° °4. Walking to the bathroom and brief periods of walking are acceptable, unless we have instructed you to be non-weight bearing. ° °5. Always wear your post-op shoe when walking.  Always use your crutches if you are to be non-weight bearing. ° °6. Do not take a shower. Baths are permissible as long as the foot is kept out of the water.  ° °7. Every hour you are awake:  °- Bend your knee 15 times. °- Flex foot 15 times °- Massage calf 15 times ° °8. Call [facility] ([phone]) if any of the following problems occur: °- You develop a temperature or fever. °- The bandage becomes saturated with blood. °- Medication does not stop your pain. °- Injury of the foot occurs. °- Any symptoms of infection including redness, odor, or red streaks running from wound. ° ° °General Anesthesia, Adult, Care After °These instructions provide you with information about caring for yourself after your procedure. Your health care provider may also give you more specific instructions. Your treatment has been planned according to current medical practices, but problems sometimes occur. Call your health care provider if you have any problems or questions after your procedure. °What can I expect after the procedure? °After the procedure, it is common to have: °· Vomiting. °· A sore throat. °· Mental slowness. ° °It is common to feel: °· Nauseous. °· Cold or shivery. °· Sleepy. °· Tired. °· Sore or achy, even in parts of your body where you did not have surgery. ° °Follow these instructions at home: °For at least 24 hours after the procedure: °· Do not: °? Participate in  activities where you could fall or become injured. °? Drive. °? Use heavy machinery. °? Drink alcohol. °? Take sleeping pills or medicines that cause drowsiness. °? Make important decisions or sign legal documents. °? Take care of children on your own. °· Rest. °Eating and drinking °· If you vomit, drink water, juice, or soup when you can drink without vomiting. °· Drink enough fluid to keep your urine clear or pale yellow. °· Make sure you have little or no nausea before eating solid foods. °· Follow the diet recommended by your health care provider. °General instructions °· Have a responsible adult stay with you until you are awake and alert. °· Return to your normal activities as told by your health care provider. Ask your health care provider what activities are safe for you. °· Take over-the-counter and prescription medicines only as told by your health care provider. °· If you smoke, do not smoke without supervision. °· Keep all follow-up visits as told by your health care provider. This is important. °Contact a health care provider if: °· You continue to have nausea or vomiting at home, and medicines are not helpful. °· You cannot drink fluids or start eating again. °· You cannot urinate after 8-12 hours. °· You develop a skin rash. °· You have fever. °· You have increasing redness at the site of your procedure. °Get help right away if: °· You have difficulty breathing. °· You have chest pain. °· You have unexpected bleeding. °· You feel that you   are having a life-threatening or urgent problem. °This information is not intended to replace advice given to you by your health care provider. Make sure you discuss any questions you have with your health care provider. °Document Released: 09/16/2000 Document Revised: 11/13/2015 Document Reviewed: 05/25/2015 °Elsevier Interactive Patient Education © 2018 [employer] ° °

## 2018-05-06 ENCOUNTER — Ambulatory Visit: Payer: 59 | Admitting: Anesthesiology

## 2018-05-06 ENCOUNTER — Encounter
Admission: RE | Disposition: A | Discharge status: Home / Self Care | Payer: Self-pay | Source: Ambulatory Visit | Attending: Podiatry

## 2018-05-06 ENCOUNTER — Ambulatory Visit
Admission: RE | Admit: 2018-05-06 | Discharge: 2018-05-06 | Disposition: A | Discharge status: Home / Self Care | Payer: 59 | Source: Ambulatory Visit | Attending: Podiatry | Admitting: Podiatry

## 2018-05-06 DIAGNOSIS — Z882 Allergy status to sulfonamides status: Secondary | ICD-10-CM | POA: Insufficient documentation

## 2018-05-06 DIAGNOSIS — G5781 Other specified mononeuropathies of right lower limb: Secondary | ICD-10-CM | POA: Insufficient documentation

## 2018-05-06 DIAGNOSIS — G43909 Migraine, unspecified, not intractable, without status migrainosus: Secondary | ICD-10-CM | POA: Diagnosis not present

## 2018-05-06 DIAGNOSIS — K219 Gastro-esophageal reflux disease without esophagitis: Secondary | ICD-10-CM | POA: Insufficient documentation

## 2018-05-06 DIAGNOSIS — G5761 Lesion of plantar nerve, right lower limb: Secondary | ICD-10-CM | POA: Diagnosis not present

## 2018-05-06 DIAGNOSIS — J4530 Mild persistent asthma, uncomplicated: Secondary | ICD-10-CM | POA: Diagnosis not present

## 2018-05-06 DIAGNOSIS — I10 Essential (primary) hypertension: Secondary | ICD-10-CM | POA: Insufficient documentation

## 2018-05-06 DIAGNOSIS — M79671 Pain in right foot: Secondary | ICD-10-CM | POA: Diagnosis not present

## 2018-05-06 DIAGNOSIS — F419 Anxiety disorder, unspecified: Secondary | ICD-10-CM | POA: Diagnosis not present

## 2018-05-06 DIAGNOSIS — E7800 Pure hypercholesterolemia, unspecified: Secondary | ICD-10-CM | POA: Diagnosis not present

## 2018-05-06 HISTORY — PX: EXCISION MORTON'S NEUROMA: SHX5013

## 2018-05-06 HISTORY — DX: Other seasonal allergic rhinitis: J30.2

## 2018-05-06 HISTORY — DX: Nausea with vomiting, unspecified: R11.2

## 2018-05-06 HISTORY — DX: Other specified postprocedural states: Z98.890

## 2018-05-06 SURGERY — EXCISION, MORTON'S NEUROMA
Anesthesia: General | Site: Toe | Laterality: Right

## 2018-05-06 MED ORDER — LIDOCAINE-EPINEPHRINE 1 %-1:100000 IJ SOLN
INTRAMUSCULAR | Status: DC | PRN
  Administered 2018-05-06: 3.5 mL

## 2018-05-06 MED ORDER — ONDANSETRON HCL 4 MG/2ML IJ SOLN: 4.0000 mg | Freq: Once | INTRAMUSCULAR | Status: DC | PRN

## 2018-05-06 MED ORDER — FENTANYL CITRATE (PF) 100 MCG/2ML IJ SOLN: 25.0000 ug | INTRAMUSCULAR | Status: DC | PRN

## 2018-05-06 MED ORDER — POVIDONE-IODINE 7.5 % EX SOLN
Freq: Once | CUTANEOUS | Status: AC
  Administered 2018-05-06: 13:00:00 via TOPICAL

## 2018-05-06 MED ORDER — OXYCODONE HCL 5 MG PO TABS: 5.0000 mg | ORAL_TABLET | Freq: Once | ORAL | Status: DC | PRN

## 2018-05-06 MED ORDER — PROPOFOL 500 MG/50ML IV EMUL
INTRAVENOUS | Status: DC | PRN
  Administered 2018-05-06: 120 ug/kg/min via INTRAVENOUS

## 2018-05-06 MED ORDER — CEFAZOLIN SODIUM-DEXTROSE 2-4 GM/100ML-% IV SOLN
2.0000 g | INTRAVENOUS | Status: AC
  Administered 2018-05-06: 2 g via INTRAVENOUS

## 2018-05-06 MED ORDER — DEXMEDETOMIDINE HCL 200 MCG/2ML IV SOLN
INTRAVENOUS | Status: DC | PRN
  Administered 2018-05-06: 12 ug via INTRAVENOUS

## 2018-05-06 MED ORDER — OXYCODONE HCL 5 MG/5ML PO SOLN: 5.0000 mg | Freq: Once | ORAL | Status: DC | PRN

## 2018-05-06 MED ORDER — LACTATED RINGERS IV SOLN
1000.0000 mL | INTRAVENOUS | Status: DC
  Administered 2018-05-06: 1000 mL via INTRAVENOUS

## 2018-05-06 MED ORDER — FENTANYL CITRATE (PF) 100 MCG/2ML IJ SOLN
INTRAMUSCULAR | Status: DC | PRN
  Administered 2018-05-06 (×2): 50 ug via INTRAVENOUS

## 2018-05-06 MED ORDER — ACETAMINOPHEN 10 MG/ML IV SOLN: 1000.0000 mg | Freq: Once | INTRAVENOUS | Status: DC | PRN

## 2018-05-06 MED ORDER — MIDAZOLAM HCL 2 MG/2ML IJ SOLN
INTRAMUSCULAR | Status: DC | PRN
  Administered 2018-05-06: 2 mg via INTRAVENOUS

## 2018-05-06 MED ORDER — LACTATED RINGERS IV SOLN: INTRAVENOUS | Status: DC

## 2018-05-06 MED ORDER — PROPOFOL 10 MG/ML IV BOLUS
INTRAVENOUS | Status: DC | PRN
  Administered 2018-05-06: 20 mg via INTRAVENOUS
  Administered 2018-05-06: 30 mg via INTRAVENOUS

## 2018-05-06 MED ORDER — BUPIVACAINE HCL (PF) 0.25 % IJ SOLN
INTRAMUSCULAR | Status: DC | PRN
  Administered 2018-05-06: 3.5 mL

## 2018-05-06 SURGICAL SUPPLY — 30 items
BANDAGE ELASTIC 4 VELCRO NS (GAUZE/BANDAGES/DRESSINGS) ×2 IMPLANT
BNDG CMPR 75X41 PLY HI ABS (GAUZE/BANDAGES/DRESSINGS) ×1
BNDG COHESIVE 4X5 TAN STRL (GAUZE/BANDAGES/DRESSINGS) ×2 IMPLANT
BNDG ESMARK 4X12 TAN STRL LF (GAUZE/BANDAGES/DRESSINGS) ×1 IMPLANT
BNDG GAUZE 4.5X4.1 6PLY STRL (MISCELLANEOUS) ×1 IMPLANT
BNDG STRETCH 4X75 STRL LF (GAUZE/BANDAGES/DRESSINGS) ×2 IMPLANT
CANISTER SUCT 1200ML W/VALVE (MISCELLANEOUS) ×2 IMPLANT
COVER LIGHT HANDLE UNIVERSAL (MISCELLANEOUS) ×2 IMPLANT
DURAPREP 26ML APPLICATOR (WOUND CARE) ×2 IMPLANT
ELECT REM PT RETURN 9FT ADLT (ELECTROSURGICAL) ×2
ELECTRODE REM PT RTRN 9FT ADLT (ELECTROSURGICAL) ×1 IMPLANT
GAUZE PETRO XEROFOAM 1X8 (MISCELLANEOUS) ×2 IMPLANT
GAUZE SPONGE 4X4 12PLY STRL (GAUZE/BANDAGES/DRESSINGS) ×2 IMPLANT
GLOVE BIO SURGEON STRL SZ7.5 (GLOVE) ×2 IMPLANT
GLOVE INDICATOR 8.0 STRL GRN (GLOVE) ×2 IMPLANT
GOWN STRL REUS W/ TWL LRG LVL3 (GOWN DISPOSABLE) ×2 IMPLANT
GOWN STRL REUS W/TWL LRG LVL3 (GOWN DISPOSABLE) ×4
NDL HYPO 25GX1X1/2 BEV (NEEDLE) ×2 IMPLANT
NEEDLE HYPO 25GX1X1/2 BEV (NEEDLE) ×4 IMPLANT
NS IRRIG 500ML POUR BTL (IV SOLUTION) ×2 IMPLANT
PACK EXTREMITY ARMC (MISCELLANEOUS) ×2 IMPLANT
PENCIL SMOKE EVACUATOR (MISCELLANEOUS) ×2 IMPLANT
STOCKINETTE IMPERVIOUS LG (DRAPES) ×2 IMPLANT
STRAP BODY AND KNEE 60X3 (MISCELLANEOUS) ×2 IMPLANT
STRIP CLOSURE SKIN 1/4X4 (GAUZE/BANDAGES/DRESSINGS) ×2 IMPLANT
SUT MNCRL+ 5-0 UNDYED PC-3 (SUTURE) ×1 IMPLANT
SUT MONOCRYL 5-0 (SUTURE) ×1
SUT VIC AB 4-0 FS2 27 (SUTURE) ×2 IMPLANT
SWABSTK COMLB BENZOIN TINCTURE (MISCELLANEOUS) ×2 IMPLANT
SYR 10ML LL (SYRINGE) ×2 IMPLANT

## 2018-05-06 NOTE — Anesthesia Procedure Notes (Signed)
Date/Time: 05/06/2018 12:55 PM Performed by: Maryan RuedWilson, Ashlen Kiger M, CRNA Pre-anesthesia Checklist: Patient identified, Emergency Drugs available, Suction available, Patient being monitored and Timeout performed Patient Re-evaluated:Patient Re-evaluated prior to induction Oxygen Delivery Method: Simple face mask Placement Confirmation: positive ETCO2 and breath sounds checked- equal and bilateral

## 2018-05-06 NOTE — Op Note (Signed)
Operative note   Surgeon:Kasiah Manka [employer] logistics/support/administrative officerowler    Assistant: None    Preop diagnosis: Right third webspace neuroma    Postop diagnosis: Same    Procedure: Right third webspace    EBL: Minimal    Anesthesia:local and IV sedation.  Local consisted of a one-to-one mixture of 1% lidocaine with epinephrine and 0.25% bupivacaine.  A total of 7 cc was used    Hemostasis: Epinephrine infiltrated along the incision site    Specimen: Neuroma right third webspace    Complications: None    Operative indications:Linda Lyons is an 47 y.o. that presents today for surgical intervention.  The risks/benefits/alternatives/complications have been discussed and consent has been given.    Procedure:  Patient was brought into the OR and placed on the operating table in thesupine position. After anesthesia was obtained theright lower extremity was prepped and draped in usual sterile fashion.  Attention was directed to the right third webspace where a dorsal incision was performed.  This was a web splitting type of incision.  Sharp and blunt dissection carried down to the deep transverse intermetatarsal ligament.  This was transected.  Further blunt dissection was taken down to a large bulky area consistent with neuroma.  The 2 distal arms were then noted and transected.  This was taken back proximal into the intermetatarsal space and transected and allowed to retract proximally.  The neuroma was sent for pathological examination.  Further inspection did not reveal any further neuroma lesion.  The wound was flushed with copious amounts of irrigation.  Layered closure was performed with a 4-0 Vicryl for the subtenons tissue and a 5-0 Monocryl for skin.  A bulky sterile dressing was applied to the right foot.    Patient tolerated the procedure and anesthesia well.  Was transported from the OR to the PACU with all vital signs stable and vascular status intact. To be discharged per routine protocol.  Will follow up  in approximately 1 week in the outpatient clinic.

## 2018-05-06 NOTE — Transfer of Care (Signed)
Immediate Anesthesia Transfer of Care Note  Patient: Linda Lyons  Procedure(s) Performed: EXCISION MORTON'S NEUROMA (Right Toe)  Patient Location: PACU  Anesthesia Type: General  Level of Consciousness: awake, alert  and patient cooperative  Airway and Oxygen Therapy: Patient Spontanous Breathing and Patient connected to supplemental oxygen  Post-op Assessment: Post-op Vital signs reviewed, Patient's Cardiovascular Status Stable, Respiratory Function Stable, Patent Airway and No signs of Nausea or vomiting  Post-op Vital Signs: Reviewed and stable  Complications: No apparent anesthesia complications

## 2018-05-06 NOTE — Anesthesia Postprocedure Evaluation (Signed)
Anesthesia Post Note  Patient: Gayland CurryValerie R Burry  Procedure(s) Performed: EXCISION MORTON'S NEUROMA (Right Toe)  Patient location during evaluation: PACU Anesthesia Type: General Level of consciousness: awake and alert, oriented and patient cooperative Pain management: pain level controlled Vital Signs Assessment: post-procedure vital signs reviewed and stable Respiratory status: spontaneous breathing, nonlabored ventilation and respiratory function stable Cardiovascular status: blood pressure returned to baseline and stable Postop Assessment: adequate PO intake Anesthetic complications: no    Reed BreechAndrea Kaleena Corrow

## 2018-05-06 NOTE — H&P (Signed)
HISTORY AND PHYSICAL INTERVAL NOTE:  05/06/2018  12:09 PM  Linda Lyons  has presented today for surgery, with the diagnosis of Lesion of right plantar nerve foot pain-right.  The various methods of treatment have been discussed with the patient.  No guarantees were given.  After consideration of risks, benefits and other options for treatment, the patient has consented to surgery.  I have reviewed the patients' chart and labs.     A history and physical examination was performed in my office.  The patient was reexamined.  There have been no changes to this history and physical examination.  Gwyneth RevelsFowler, Shaniece Bussa A

## 2018-05-07 ENCOUNTER — Encounter: Payer: Self-pay | Admitting: Podiatry

## 2018-05-08 LAB — SURGICAL PATHOLOGY

## 2018-05-14 DIAGNOSIS — J3081 Allergic rhinitis due to animal (cat) (dog) hair and dander: Secondary | ICD-10-CM | POA: Diagnosis not present

## 2018-05-14 DIAGNOSIS — J301 Allergic rhinitis due to pollen: Secondary | ICD-10-CM | POA: Diagnosis not present

## 2018-05-14 DIAGNOSIS — J3089 Other allergic rhinitis: Secondary | ICD-10-CM | POA: Diagnosis not present

## 2018-05-18 DIAGNOSIS — E7800 Pure hypercholesterolemia, unspecified: Secondary | ICD-10-CM | POA: Diagnosis not present

## 2018-05-18 DIAGNOSIS — I10 Essential (primary) hypertension: Secondary | ICD-10-CM | POA: Diagnosis not present

## 2018-05-18 DIAGNOSIS — K219 Gastro-esophageal reflux disease without esophagitis: Secondary | ICD-10-CM | POA: Diagnosis not present

## 2018-05-19 DIAGNOSIS — J3089 Other allergic rhinitis: Secondary | ICD-10-CM | POA: Diagnosis not present

## 2018-05-19 DIAGNOSIS — J3081 Allergic rhinitis due to animal (cat) (dog) hair and dander: Secondary | ICD-10-CM | POA: Diagnosis not present

## 2018-05-19 DIAGNOSIS — J301 Allergic rhinitis due to pollen: Secondary | ICD-10-CM | POA: Diagnosis not present

## 2018-05-28 DIAGNOSIS — Z23 Encounter for immunization: Secondary | ICD-10-CM | POA: Diagnosis not present

## 2018-06-02 DIAGNOSIS — J3089 Other allergic rhinitis: Secondary | ICD-10-CM | POA: Diagnosis not present

## 2018-06-02 DIAGNOSIS — J3081 Allergic rhinitis due to animal (cat) (dog) hair and dander: Secondary | ICD-10-CM | POA: Diagnosis not present

## 2018-06-02 DIAGNOSIS — J301 Allergic rhinitis due to pollen: Secondary | ICD-10-CM | POA: Diagnosis not present

## 2018-06-16 DIAGNOSIS — J301 Allergic rhinitis due to pollen: Secondary | ICD-10-CM | POA: Diagnosis not present

## 2018-06-16 DIAGNOSIS — J3081 Allergic rhinitis due to animal (cat) (dog) hair and dander: Secondary | ICD-10-CM | POA: Diagnosis not present

## 2018-06-16 DIAGNOSIS — J3089 Other allergic rhinitis: Secondary | ICD-10-CM | POA: Diagnosis not present

## 2018-06-19 DIAGNOSIS — J301 Allergic rhinitis due to pollen: Secondary | ICD-10-CM | POA: Diagnosis not present

## 2018-06-22 DIAGNOSIS — J3081 Allergic rhinitis due to animal (cat) (dog) hair and dander: Secondary | ICD-10-CM | POA: Diagnosis not present

## 2018-06-22 DIAGNOSIS — J3089 Other allergic rhinitis: Secondary | ICD-10-CM | POA: Diagnosis not present

## 2018-06-25 DIAGNOSIS — J301 Allergic rhinitis due to pollen: Secondary | ICD-10-CM | POA: Diagnosis not present

## 2018-06-25 DIAGNOSIS — J3089 Other allergic rhinitis: Secondary | ICD-10-CM | POA: Diagnosis not present

## 2018-06-25 DIAGNOSIS — J3081 Allergic rhinitis due to animal (cat) (dog) hair and dander: Secondary | ICD-10-CM | POA: Diagnosis not present

## 2018-07-02 DIAGNOSIS — J3081 Allergic rhinitis due to animal (cat) (dog) hair and dander: Secondary | ICD-10-CM | POA: Diagnosis not present

## 2018-07-02 DIAGNOSIS — J301 Allergic rhinitis due to pollen: Secondary | ICD-10-CM | POA: Diagnosis not present

## 2018-07-02 DIAGNOSIS — J3089 Other allergic rhinitis: Secondary | ICD-10-CM | POA: Diagnosis not present

## 2018-07-11 ENCOUNTER — Ambulatory Visit
Admission: EM | Admit: 2018-07-11 | Discharge: 2018-07-11 | Disposition: A | Discharge status: Home / Self Care | Payer: 59 | Attending: Family Medicine | Admitting: Family Medicine

## 2018-07-11 ENCOUNTER — Encounter: Payer: Self-pay | Admitting: Emergency Medicine

## 2018-07-11 ENCOUNTER — Other Ambulatory Visit: Payer: Self-pay

## 2018-07-11 DIAGNOSIS — L237 Allergic contact dermatitis due to plants, except food: Secondary | ICD-10-CM

## 2018-07-11 DIAGNOSIS — Y93H1 Activity, digging, shoveling and raking: Secondary | ICD-10-CM

## 2018-07-11 MED ORDER — PREDNISONE 10 MG PO TABS: ORAL_TABLET | ORAL | 0 refills | Status: AC

## 2018-07-11 NOTE — ED Provider Notes (Signed)
[facility] ____________________________________________  Time seen: Approximately 11:47 AM  I have reviewed the triage vital signs and the nursing notes.   HISTORY  Chief Complaint Rash (APPOINTMENT)   HPI Linda Lyons is a 48 y.o. female presenting for evaluation of itchy rash to face, neck and left arm present for the last 2 days.  States rash continues to spread.  States just prior to rash onset she was outside as she states her rabbit had died and she was bearing it.  States she was along the tree line and digging in the ground and leaves.  Patient states this feels similar to previous poison oak and poison ivy.  Denies any other changes in foods, medicines, lotions, detergents or other contacts.  Denies any lip, tongue, oral edema, difficulty swallowing or shortness of breath.  No fevers.  Denies insect bite.  Reports otherwise doing well denies other complaints.  States rash is itchy.  Denies pain.  States has not been applying topical creams due to areas on her face.  Denies other complaints.  Marina Goodell, MD: PCP    Past Medical History:  Diagnosis Date  . Allergic state   . Anxiety   . Asthma   . Eczema   . Endometriosis   . Erosive esophagitis   . Erosive gastritis   . Headache    migraines  . HH (hiatus hernia)   . Hyperlipemia   . Hyperlipidemia   . Hypertension   . PONV (postoperative nausea and vomiting)   . Seasonal allergies     Patient Active Problem List   Diagnosis Date Noted  . Paraesophageal hernia 09/05/2015    Past Surgical History:  Procedure Laterality Date  . ABDOMINAL HYSTERECTOMY    . BLADDER SURGERY    . CYST EXCISION Right    rt. wrist  . ESOPHAGOGASTRODUODENOSCOPY (EGD) WITH PROPOFOL N/A 02/13/2015   Procedure: ESOPHAGOGASTRODUODENOSCOPY (EGD) WITH PROPOFOL;  Surgeon: Christena Deem, MD;  Location: [facility] END[facility]COPY;  Service: Endoscopy;  Laterality: N/A;  . ESOPHAGOGASTRODUODENOSCOPY (EGD) WITH PROPOFOL N/A  08/01/2015   Procedure: ESOPHAGOGASTRODUODENOSCOPY (EGD) WITH PROPOFOL;  Surgeon: Christena Deem, MD;  Location: [facility] ENDOSCOPY;  Service: Endoscopy;  Laterality: N/A;  . EXCISION MORTON'S NEUROMA Right 05/06/2018   Procedure: EXCISION MORTON'S NEUROMA;  Surgeon: Gwyneth Revels, DPM;  Location: [facility];  Service: Podiatry;  Laterality: Right;  IVA LOCAL     No current facility-administered medications for this encounter.   Current Outpatient Medications:  .  amLODipine (NORVASC) 5 MG tablet, Take 5 mg by mouth daily., Disp: , Rfl:  .  calcium carbonate (OS-CAL) 600 MG TABS tablet, Take 600 mg by mouth 2 (two) times daily with a meal., Disp: , Rfl:  .  EMGALITY 120 MG/ML SOAJ, , Disp: , Rfl:  .  flurbiprofen (ANSAID) 100 MG tablet, Take 100 mg by mouth 3 (three) times daily as needed., Disp: , Rfl:  .  Ketorolac Tromethamine (SPRIX) 15.75 MG/SPRAY SOLN, Place into the nose as needed., Disp: , Rfl:  .  loratadine (CLARITIN) 10 MG tablet, Take 10 mg by mouth daily., Disp: , Rfl:  .  metoprolol succinate (TOPROL-XL) 100 MG 24 hr tablet, Take 100 mg by mouth daily. Take with or immediately following a meal., Disp: , Rfl:  .  montelukast (SINGULAIR) 10 MG tablet, Take 10 mg by mouth daily. , Disp: , Rfl:  .  Multiple Vitamin (MULTIVITAMIN) tablet, Take 1 tablet by mouth daily., Disp: , Rfl:  .  venlafaxine (EFFEXOR) 75 MG tablet, Take 75 mg by mouth 2 (two) times daily. Take 150 mg. By mouth 2 times a day, Disp: , Rfl:  .  chlorzoxazone (PARAFON) 500 MG tablet, Take 500 mg by mouth 4 (four) times daily as needed for muscle spasms., Disp: , Rfl:  .  EPINEPHrine 0.3 mg/0.3 mL IJ SOAJ injection, Inject into the muscle once., Disp: , Rfl:  .  NON FORMULARY, every 30 (thirty) days., Disp: , Rfl:  .  ondansetron (ZOFRAN ODT) 4 MG disintegrating tablet, Take 1 tablet (4 mg total) by mouth every 8 (eight) hours as needed for nausea or vomiting., Disp: 20 tablet, Rfl: 0 .   oxyCODONE-acetaminophen (ROXICET) 5-325 MG tablet, Take 1 tablet by mouth every 6 (six) hours as needed. (Patient not taking: Reported on 04/28/2018), Disp: 20 tablet, Rfl: 0 .  predniSONE (DELTASONE) 10 MG tablet, Take 60mg  orally day one, then 55 mg orally day two, then 50 mg orally day three, then taper by 5 mg daily over 12 days until complete., Disp: 35 tablet, Rfl: 0  Allergies Estrogens; Lisinopril; and Sulfacetamide sodium  Family History  Problem Relation Age of Onset  . Breast cancer Mother [id number]  . Breast cancer Maternal Aunt [phone]    Social History Social History   Tobacco Use  . Smoking status: Never Smoker  . Smokeless tobacco: Never Used  Substance Use Topics  . Alcohol use: No  . Drug use: No    Review of Systems Constitutional: No fever Cardiovascular: Denies chest pain. Respiratory: Denies shortness of breath. Gastrointestinal: No abdominal pain.   Musculoskeletal: Negative for back pain. Skin positive for rash ____________________________________________   PHYSICAL EXAM:  VITAL SIGNS: ED Triage Vitals  Enc Vitals Group     BP 07/11/18 1031 (!) 136/93     Pulse Rate 07/11/18 1031 68     Resp 07/11/18 1031 16     Temp 07/11/18 1031 98.5 F (36.9 C)     Temp Source 07/11/18 1031 Oral     SpO2 07/11/18 1031 100 %     Weight 07/11/18 1024 160 lb (72.6 kg)     Height 07/11/18 1024 5\' 4"  (1.626 m)     Head Circumference --      Peak Flow --      Pain Score 07/11/18 1024 0     Pain Loc --      Pain Edu? --      Excl. in GC? --     Constitutional: Alert and oriented. Well appearing and in no acute distress. Eyes: Conjunctivae are normal. PERRL. ENT      Head: Normocephalic and atraumatic.      Ears: Nontender, normal TM bilaterally.         Nose: No congestion      Mouth/Throat: Mucous membranes are moist.Oropharynx non-erythematous.  No tonsillar swelling or exudate. Neck: No stridor. Supple without meningismus.    Hematological/Lymphatic/Immunilogical: No cervical lymphadenopathy. Cardiovascular: Normal rate, regular rhythm. Grossly normal heart sounds.  Good peripheral circulation. Respiratory: Normal respiratory effort without tachypnea nor retractions. Breath sounds are clear and equal bilaterally. No wheezes, rales, rhonchi. Musculoskeletal: Steady gait.  No edema noted. Neurologic:  Normal speech and language.  Speech is normal. No gait instability.  Skin:  Skin is warm, dry.  Except: mild erythematous pruritic rash present to left proximal forearm, scattered to left anterior neck and posterior neck as well as scattered to face, rash is mildly erythematous papular, nonvesicular, no drainage, no induration, no  further surrounding erythema. Psychiatric: Mood and affect are normal. Speech and behavior are normal. Patient exhibits appropriate insight and judgment   ___________________________________________   LABS (all labs ordered are listed, but only abnormal results are displayed)  Labs Reviewed - No data to display ____________________________________________  PROCEDURES Procedures    INITIAL IMPRESSION / ASSESSMENT AND PLAN / ED COURSE  Pertinent labs & imaging results that were available during my care of the patient were reviewed by me and considered in my medical decision making (see chart for details).  Well-appearing patient.  No acute distress.  Rash clinical appearance consistent with contact dermatitis, suspect plant.  Will treat with 12-day prednisone taper.  Evidence of scratching, over-the-counter supportive care.  Avoid trigger. Discussed indication, risks and benefits of medications with patient.  Discussed follow up with Primary care physician this week. Discussed follow up and return parameters including no resolution or any worsening concerns. Patient verbalized understanding and agreed to plan.   ____________________________________________   FINAL CLINICAL  IMPRESSION(S) / ED DIAGNOSES  Final diagnoses:  Allergic contact dermatitis due to plants, except food     ED Discharge Orders         Ordered    predniSONE (DELTASONE) 10 MG tablet     07/11/18 1110           Note: This dictation was prepared with Dragon dictation along with smaller phrase technology. Any transcriptional errors that result from this process are unintentional.         Renford DillsMiller, Jarita Raval, NP 07/11/18 1156

## 2018-07-11 NOTE — Discharge Instructions (Addendum)
Take medication as prescribed. Rest. Drink plenty of fluids. Avoid scratching. Avoid trigger. ° °Follow up with your primary care physician this week as needed. Return to Urgent care for new or worsening concerns.  ° °

## 2018-07-11 NOTE — ED Triage Notes (Signed)
Patient c/o itchy rash on her face and neck that started yesterday.

## 2018-07-16 DIAGNOSIS — L237 Allergic contact dermatitis due to plants, except food: Secondary | ICD-10-CM | POA: Diagnosis not present

## 2018-07-24 DIAGNOSIS — L239 Allergic contact dermatitis, unspecified cause: Secondary | ICD-10-CM | POA: Diagnosis not present

## 2018-08-11 DIAGNOSIS — J3081 Allergic rhinitis due to animal (cat) (dog) hair and dander: Secondary | ICD-10-CM | POA: Diagnosis not present

## 2018-08-11 DIAGNOSIS — J3089 Other allergic rhinitis: Secondary | ICD-10-CM | POA: Diagnosis not present

## 2018-08-11 DIAGNOSIS — J301 Allergic rhinitis due to pollen: Secondary | ICD-10-CM | POA: Diagnosis not present

## 2018-08-14 DIAGNOSIS — J3089 Other allergic rhinitis: Secondary | ICD-10-CM | POA: Diagnosis not present

## 2018-08-14 DIAGNOSIS — J301 Allergic rhinitis due to pollen: Secondary | ICD-10-CM | POA: Diagnosis not present

## 2018-08-14 DIAGNOSIS — J3081 Allergic rhinitis due to animal (cat) (dog) hair and dander: Secondary | ICD-10-CM | POA: Diagnosis not present

## 2018-08-25 DIAGNOSIS — J3089 Other allergic rhinitis: Secondary | ICD-10-CM | POA: Diagnosis not present

## 2018-08-25 DIAGNOSIS — J3081 Allergic rhinitis due to animal (cat) (dog) hair and dander: Secondary | ICD-10-CM | POA: Diagnosis not present

## 2018-08-25 DIAGNOSIS — J301 Allergic rhinitis due to pollen: Secondary | ICD-10-CM | POA: Diagnosis not present

## 2018-09-03 DIAGNOSIS — J301 Allergic rhinitis due to pollen: Secondary | ICD-10-CM | POA: Diagnosis not present

## 2018-09-03 DIAGNOSIS — J3089 Other allergic rhinitis: Secondary | ICD-10-CM | POA: Diagnosis not present

## 2018-09-03 DIAGNOSIS — J3081 Allergic rhinitis due to animal (cat) (dog) hair and dander: Secondary | ICD-10-CM | POA: Diagnosis not present

## 2018-09-07 DIAGNOSIS — G43009 Migraine without aura, not intractable, without status migrainosus: Secondary | ICD-10-CM | POA: Diagnosis not present

## 2018-09-17 DIAGNOSIS — J301 Allergic rhinitis due to pollen: Secondary | ICD-10-CM | POA: Diagnosis not present

## 2018-09-17 DIAGNOSIS — J3089 Other allergic rhinitis: Secondary | ICD-10-CM | POA: Diagnosis not present

## 2018-09-17 DIAGNOSIS — J3081 Allergic rhinitis due to animal (cat) (dog) hair and dander: Secondary | ICD-10-CM | POA: Diagnosis not present

## 2018-10-01 DIAGNOSIS — J301 Allergic rhinitis due to pollen: Secondary | ICD-10-CM | POA: Diagnosis not present

## 2018-10-01 DIAGNOSIS — J3081 Allergic rhinitis due to animal (cat) (dog) hair and dander: Secondary | ICD-10-CM | POA: Diagnosis not present

## 2018-10-01 DIAGNOSIS — J3089 Other allergic rhinitis: Secondary | ICD-10-CM | POA: Diagnosis not present

## 2018-10-09 DIAGNOSIS — J3089 Other allergic rhinitis: Secondary | ICD-10-CM | POA: Diagnosis not present

## 2018-10-09 DIAGNOSIS — J3081 Allergic rhinitis due to animal (cat) (dog) hair and dander: Secondary | ICD-10-CM | POA: Diagnosis not present

## 2018-10-09 DIAGNOSIS — J301 Allergic rhinitis due to pollen: Secondary | ICD-10-CM | POA: Diagnosis not present

## 2018-10-15 DIAGNOSIS — J3089 Other allergic rhinitis: Secondary | ICD-10-CM | POA: Diagnosis not present

## 2018-10-15 DIAGNOSIS — J3081 Allergic rhinitis due to animal (cat) (dog) hair and dander: Secondary | ICD-10-CM | POA: Diagnosis not present

## 2018-10-15 DIAGNOSIS — J301 Allergic rhinitis due to pollen: Secondary | ICD-10-CM | POA: Diagnosis not present

## 2018-10-20 DIAGNOSIS — J301 Allergic rhinitis due to pollen: Secondary | ICD-10-CM | POA: Diagnosis not present

## 2018-10-20 DIAGNOSIS — J3081 Allergic rhinitis due to animal (cat) (dog) hair and dander: Secondary | ICD-10-CM | POA: Diagnosis not present

## 2018-10-20 DIAGNOSIS — J3089 Other allergic rhinitis: Secondary | ICD-10-CM | POA: Diagnosis not present

## 2018-10-22 DIAGNOSIS — J3081 Allergic rhinitis due to animal (cat) (dog) hair and dander: Secondary | ICD-10-CM | POA: Diagnosis not present

## 2018-10-22 DIAGNOSIS — J3089 Other allergic rhinitis: Secondary | ICD-10-CM | POA: Diagnosis not present

## 2018-10-22 DIAGNOSIS — J301 Allergic rhinitis due to pollen: Secondary | ICD-10-CM | POA: Diagnosis not present

## 2018-11-03 DIAGNOSIS — J301 Allergic rhinitis due to pollen: Secondary | ICD-10-CM | POA: Diagnosis not present

## 2018-11-03 DIAGNOSIS — J3089 Other allergic rhinitis: Secondary | ICD-10-CM | POA: Diagnosis not present

## 2018-11-03 DIAGNOSIS — J3081 Allergic rhinitis due to animal (cat) (dog) hair and dander: Secondary | ICD-10-CM | POA: Diagnosis not present

## 2019-01-04 ENCOUNTER — Other Ambulatory Visit: Payer: Self-pay | Admitting: Family Medicine

## 2019-01-04 DIAGNOSIS — Z1231 Encounter for screening mammogram for malignant neoplasm of breast: Secondary | ICD-10-CM

## 2019-01-20 ENCOUNTER — Ambulatory Visit
Admission: RE | Admit: 2019-01-20 | Discharge: 2019-01-20 | Disposition: A | Discharge status: Home / Self Care | Payer: 59 | Source: Ambulatory Visit | Attending: Family Medicine | Admitting: Family Medicine

## 2019-01-20 ENCOUNTER — Encounter (INDEPENDENT_AMBULATORY_CARE_PROVIDER_SITE_OTHER): Payer: Self-pay

## 2019-01-20 ENCOUNTER — Other Ambulatory Visit: Payer: Self-pay

## 2019-01-20 DIAGNOSIS — Z1231 Encounter for screening mammogram for malignant neoplasm of breast: Secondary | ICD-10-CM | POA: Diagnosis not present

## 2019-05-05 IMAGING — MG MM DIGITAL SCREENING BILAT W/ TOMO W/ CAD
8 series · 8 of 24 positions shown · non-contrast
Comparison: Previous exam(s).

CLINICAL DATA: Screening.

EXAM:
DIGITAL SCREENING BILATERAL MAMMOGRAM WITH TOMO AND CAD

[R CC synth-2D]
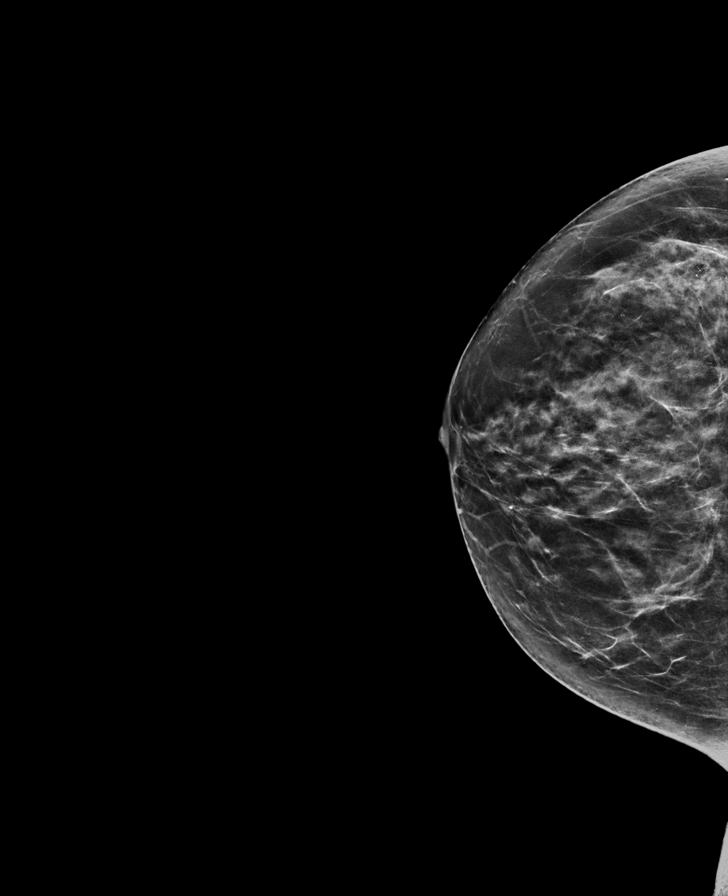

[R MLO synth-2D]
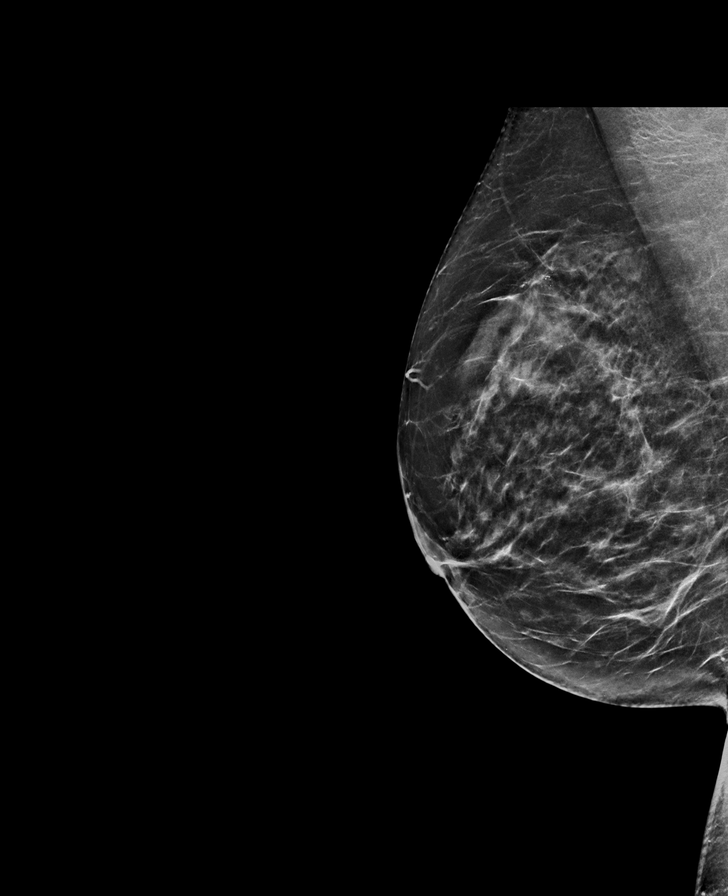

[L MLO synth-2D]
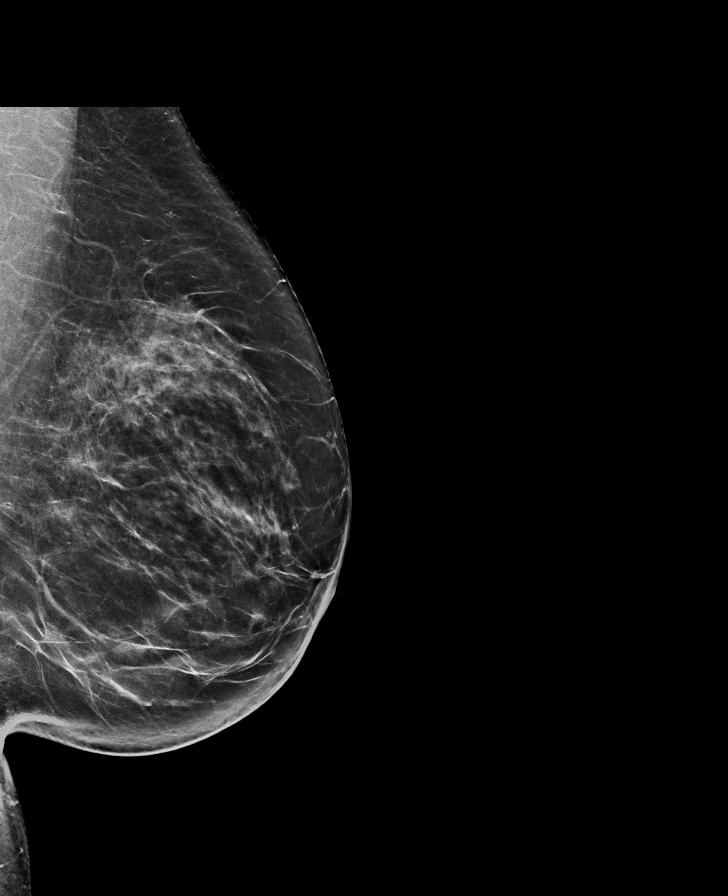

[L CC synth-2D]
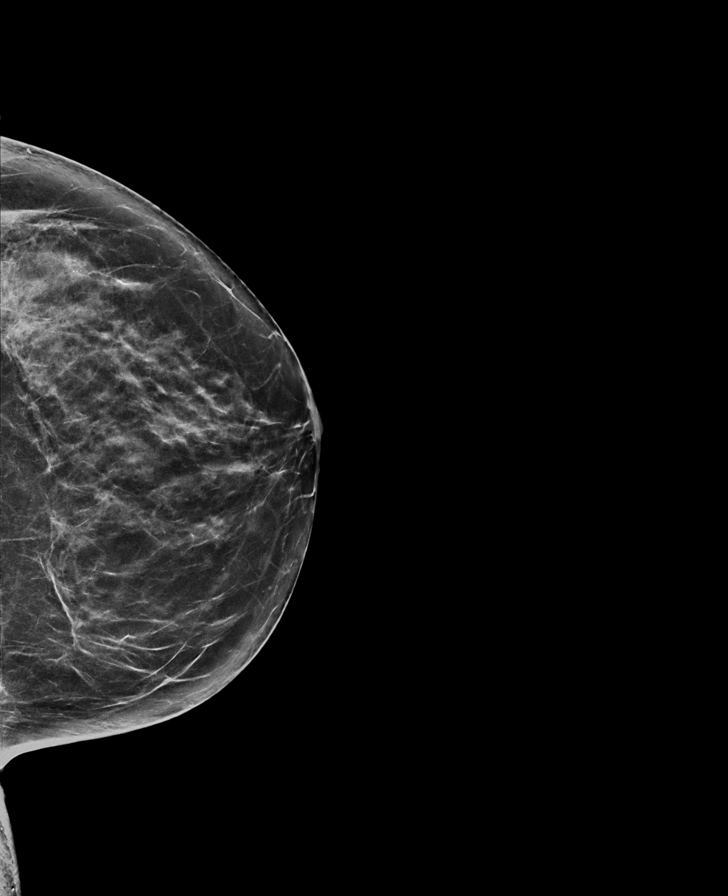

[L CC tomo · tomo slice 35/70.0]
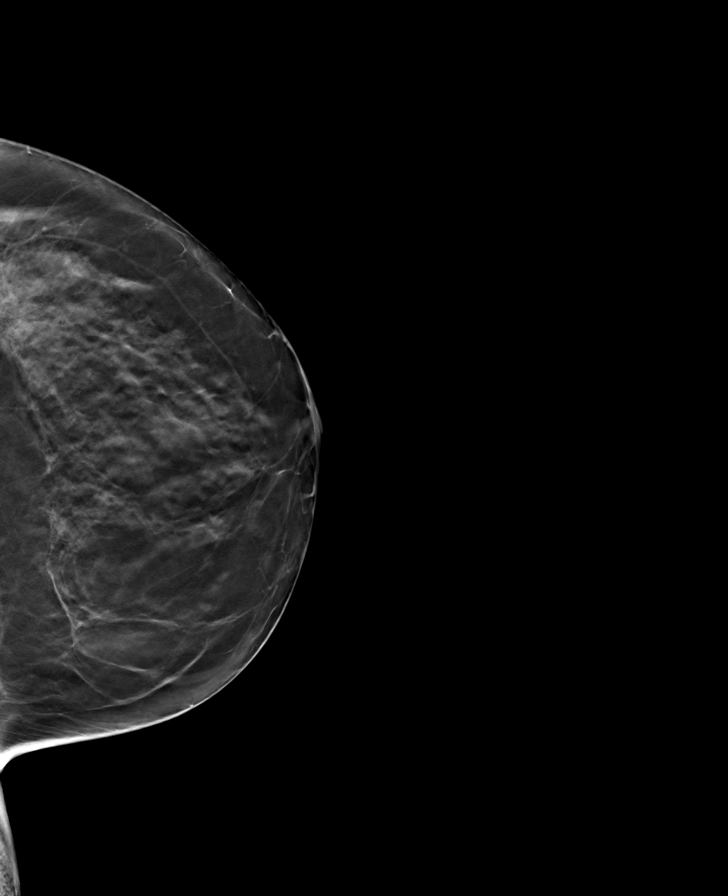

[R CC tomo · tomo slice 31/62.0]
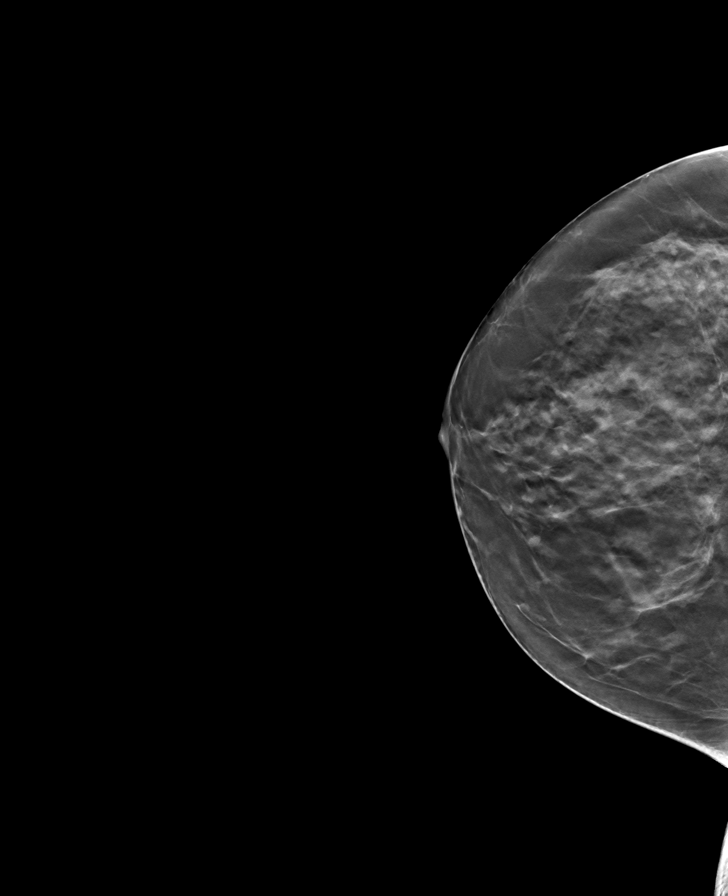

[L MLO tomo · tomo slice 37/73.0]
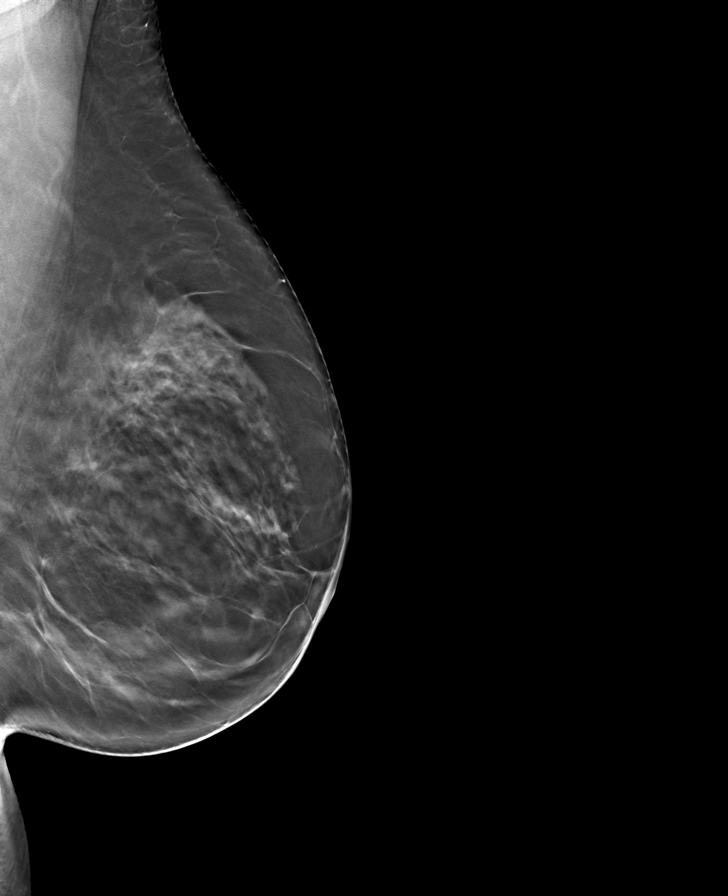

[R MLO tomo · tomo slice 34/67.0]
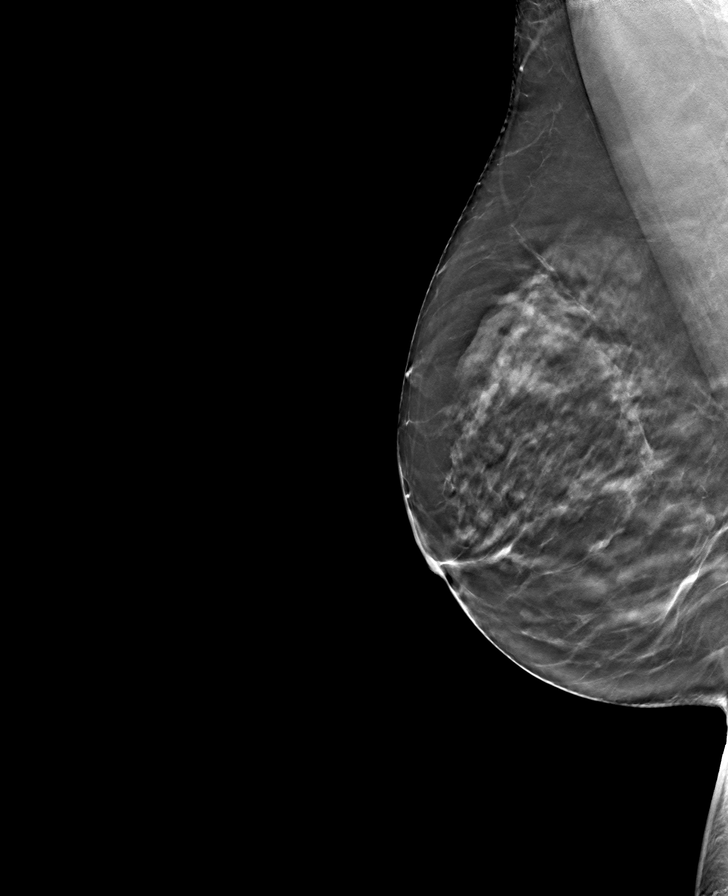

[8 of 24 positions shown; findings below may reference images not displayed]

ACR Breast Density Category c: The breast tissue is heterogeneously
dense, which may obscure small masses.
FINDINGS: There are no findings suspicious for malignancy. Images were
processed with CAD.
IMPRESSION: No mammographic evidence of malignancy. A result letter of this
screening mammogram will be mailed directly to the patient.

RECOMMENDATION:
Screening mammogram in one year. (Code:[id number])

BI-RADS CATEGORY  1: Negative.

## 2020-01-25 ENCOUNTER — Other Ambulatory Visit: Payer: Self-pay

## 2020-01-25 ENCOUNTER — Ambulatory Visit: Payer: 59 | Admitting: Dermatology

## 2020-01-25 DIAGNOSIS — L237 Allergic contact dermatitis due to plants, except food: Secondary | ICD-10-CM | POA: Diagnosis not present

## 2020-01-25 DIAGNOSIS — R21 Rash and other nonspecific skin eruption: Secondary | ICD-10-CM | POA: Diagnosis not present

## 2020-01-25 MED ORDER — TRIAMCINOLONE ACETONIDE 40 MG/ML IJ SUSP: 40.0000 mg | Freq: Once | INTRAMUSCULAR | Status: AC

## 2020-01-25 MED ORDER — CLOBETASOL PROP EMOLLIENT BASE 0.05 % EX CREA: 1.0000 "application " | TOPICAL_CREAM | Freq: Two times a day (BID) | CUTANEOUS | 0 refills | Status: AC

## 2020-01-25 NOTE — Progress Notes (Signed)
   Follow-Up Visit   Subjective  Linda Lyons is a 49 y.o. female who presents for the following: Rash (Poison ivy on arms and legs x ~3 weeks. Has tried to treat with OTC Caladryl but it did not help and it seems to be spreading).  The following portions of the chart were reviewed this encounter and updated as appropriate:  Tobacco  Allergies  Meds  Problems  Med Hx  Surg Hx  Fam Hx     Review of Systems:  No other skin or systemic complaints except as noted in HPI or Assessment and Plan.  Objective  Well appearing patient in no apparent distress; mood and affect are within normal limits.  A focused examination was performed including arms, legs, buttocks. Relevant physical exam findings are noted in the Assessment and Plan.  Objective  Legs, arms: Linear pink patches.   Assessment & Plan  Rash - Allergic Contact Dermatitis to plant (poison ivy) - severe; persistent and symptomatic Legs, arms  Clobetasol Prop Emollient Base (CLOBETASOL PROPIONATE E) 0.05 % emollient cream - Legs, arms  triamcinolone acetonide (KENALOG-40) injection 40 mg - Legs, arms   Return if symptoms worsen or fail to improve.  I, Joanie Coddington, CMA, am acting as scribe for Armida Sans, MD .  Documentation: I have reviewed the above documentation for accuracy and completeness, and I agree with the above.  Armida Sans, MD

## 2020-01-26 ENCOUNTER — Encounter: Payer: Self-pay | Admitting: Dermatology
